# Patient Record
Sex: Female | Born: 1995 | Race: White | Hispanic: No | Marital: Single | Smoking: Current every day smoker
Health system: Southern US, Community
[De-identification: ages and names within clinical notes are randomized; demographics above are authoritative.]

## PROBLEM LIST (undated history)

## (undated) DIAGNOSIS — T7840XA Allergy, unspecified, initial encounter: Secondary | ICD-10-CM

## (undated) DIAGNOSIS — J45909 Unspecified asthma, uncomplicated: Secondary | ICD-10-CM

## (undated) DIAGNOSIS — Z91018 Allergy to other foods: Secondary | ICD-10-CM

## (undated) HISTORY — DX: Allergy, unspecified, initial encounter: T78.40XA

## (undated) HISTORY — PX: HX NO SURGICAL PROCEDURES: 2100001501

## (undated) HISTORY — DX: Allergy to other foods: Z91.018

## (undated) HISTORY — DX: Unspecified asthma, uncomplicated: J45.909

## (undated) HISTORY — PX: CHOLECYSTECTOMY: SHX55

---
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
## 2018-01-21 ENCOUNTER — Encounter (HOSPITAL_COMMUNITY): Payer: Self-pay | Admitting: Emergency Medicine

## 2018-01-21 ENCOUNTER — Other Ambulatory Visit: Payer: Self-pay

## 2018-01-21 ENCOUNTER — Emergency Department (HOSPITAL_COMMUNITY)
Admission: EM | Admit: 2018-01-21 | Discharge: 2018-01-21 | Disposition: A | Discharge status: Home / Self Care | Payer: Self-pay | Attending: Emergency Medicine | Admitting: Emergency Medicine

## 2018-01-21 DIAGNOSIS — F17210 Nicotine dependence, cigarettes, uncomplicated: Secondary | ICD-10-CM | POA: Insufficient documentation

## 2018-01-21 DIAGNOSIS — Z9049 Acquired absence of other specified parts of digestive tract: Secondary | ICD-10-CM | POA: Insufficient documentation

## 2018-01-21 DIAGNOSIS — M5442 Lumbago with sciatica, left side: Secondary | ICD-10-CM | POA: Insufficient documentation

## 2018-01-21 LAB — PREGNANCY, URINE: Preg Test, Ur: NEGATIVE

## 2018-01-21 MED ORDER — CYCLOBENZAPRINE HCL 10 MG PO TABS: 10.0000 mg | ORAL_TABLET | Freq: Two times a day (BID) | ORAL | 0 refills | Status: DC | PRN

## 2018-01-21 MED ORDER — LIDOCAINE 5 % EX PTCH
1.0000 | MEDICATED_PATCH | CUTANEOUS | Status: DC
  Administered 2018-01-21: 1 via TRANSDERMAL
  Filled 2018-01-21: qty 1

## 2018-01-21 MED ORDER — NAPROXEN 500 MG PO TABS: 500.0000 mg | ORAL_TABLET | Freq: Two times a day (BID) | ORAL | 0 refills | Status: DC

## 2018-01-21 MED ORDER — KETOROLAC TROMETHAMINE 30 MG/ML IJ SOLN
30.0000 mg | Freq: Once | INTRAMUSCULAR | Status: AC
  Administered 2018-01-21: 30 mg via INTRAMUSCULAR
  Filled 2018-01-21: qty 1

## 2018-01-21 MED ORDER — METHOCARBAMOL 500 MG PO TABS
500.0000 mg | ORAL_TABLET | Freq: Once | ORAL | Status: AC
  Administered 2018-01-21: 500 mg via ORAL
  Filled 2018-01-21: qty 1

## 2018-01-21 NOTE — Discharge Instructions (Signed)
You were seen here today for Back Pain: Low back pain is discomfort in the lower back that may be due to injuries to muscles and ligaments around the spine. Occasionally, it may be caused by a problem to a part of the spine called a disc. Your back pain should be treated with medicines listed below as well as back exercises and this back pain should get better over the next 2 weeks. Most patients get completely well in 4 weeks. It is important to know however, if you develop severe or worsening pain, low back pain with fever, numbness, weakness or inability to walk or urinate, you should return to the ER immediately.  Please follow up with your doctor this week for a recheck if still having symptoms.  HOME INSTRUCTIONS Self - care:  The application of heat can help soothe the pain.  Maintaining your daily activities, including walking (this is encouraged), as it will help you get better faster than just staying in bed. Do not life, push, pull anything more than 10 pounds for the next week. I am attaching back exercises that you can do at home to help facilitate your recovery.   Back Exercises - I have attached a handout on back exercises that can be done at home to help facilitate your recovery.   Medications are also useful to help with pain control.  In addition to the medications listed below you can also use over-the-counter remedies such as salon pas lidocane patches, Biofreeze gel, as well as ice and heat.  Acetaminophen.  This medication is generally safe, and found over the counter. Take as directed for your age. You should not take more than 8 of the extra strength (500mg ) pills a day (max dose is 4000mg  total OVER one day)  Non steroidal anti inflammatory: This includes medications including Ibuprofen, naproxen and Mobic; These medications help both pain and swelling and are very useful in treating back pain.  They should be taken with food, as they can cause stomach upset, and more seriously,  stomach bleeding. Do not combine the medications.   Muscle relaxants:  These medications can help with muscle tightness that is a cause of lower back pain.  Most of these medications can cause drowsiness, and it is not safe to drive or use dangerous machinery while taking them. They are primarily helpful when taken at night before sleep.  You will need to follow up with your primary healthcare provider in 1-2 weeks for reassessment and persistent symptoms.  Be aware that if you develop new symptoms, such as a fever, leg weakness, difficulty with or loss of control of your urine or bowels, abdominal pain, or more severe pain, you will need to seek medical attention and/or return to the Emergency department. Additional Information:  Your vital signs today were: BP 126/77 (BP Location: Left Arm)    Pulse 93    Temp 98 F (36.7 C) (Oral)    Resp 16    SpO2 99%  If your blood pressure (BP) was elevated above 135/85 this visit, please have this repeated by your doctor within one month. ---------------

## 2018-01-21 NOTE — ED Provider Notes (Signed)
[facility]-EMERGENCY DEPT Provider Note   CSN: [id number] Arrival date & time: 01/21/18  1834     History   Chief Complaint Chief Complaint  Patient presents with  . Leg Pain  . Back Pain    HPI Jane Carpenter is a 22 y.o. female.  Jane Carpenter is a 22 y.o. Female who is otherwise healthy, presents for evaluation of pain in her low back and left leg.  Pain was initially mild and started a few weeks ago but over the past 3 to 4 days is been more persistent and worsening.  Patient reports she works at a [employer] where she does a lot of heavy lifting frequently and wonders if this may have caused her she reports shooting and cramping pain down into her legs as well as pain over the left side of her low back, she reports it feels like her lower back is in a knot.  She denies any associated numbness or tingling in her legs.  No loss of bowel or bladder control or saddle anesthesia.  No associated abdominal pain, no dysuria urinary frequency.  No nausea or vomiting, no fevers or chills.  She has been taking ibuprofen intermittently with some improvement has not tried anything else to treat her symptoms.  No history of IV drug use or cancer.     No past medical history on file.  There are no active problems to display for this patient.   Past Surgical History:  Procedure Laterality Date  . CHOLECYSTECTOMY       OB History   None      Home Medications    Prior to Admission medications   Medication Sig Start Date End Date Taking? Authorizing Provider  cyclobenzaprine (FLEXERIL) 10 MG tablet Take 1 tablet (10 mg total) by mouth 2 (two) times daily as needed for muscle spasms. 01/21/18   Dartha Lodge, PA-C  naproxen (NAPROSYN) 500 MG tablet Take 1 tablet (500 mg total) by mouth 2 (two) times daily. 01/21/18   Dartha Lodge, PA-C    Family History No family history on file.  Social History Social History   Tobacco Use  .  Smoking status: Current Every Day Smoker    Types: Cigarettes  . Smokeless tobacco: Never Used  Substance Use Topics  . Alcohol use: Not on file  . Drug use: Not on file     Allergies   Patient has no known allergies.   Review of Systems Review of Systems  Constitutional: Negative for chills and fever.  HENT: Negative.   Respiratory: Negative for shortness of breath.   Cardiovascular: Negative for chest pain.  Gastrointestinal: Negative for abdominal pain, constipation, diarrhea, nausea and vomiting.  Genitourinary: Negative for dysuria, flank pain, frequency and hematuria.  Musculoskeletal: Positive for back pain and myalgias. Negative for arthralgias, gait problem, joint swelling and neck pain.  Skin: Negative for color change, rash and wound.  Neurological: Negative for weakness and numbness.     Physical Exam Updated Vital Signs BP 126/77 (BP Location: Left Arm)   Pulse 93   Temp 98 F (36.7 C) (Oral)   Resp 16   SpO2 99%   Physical Exam  Constitutional: She is oriented to person, place, and time. She appears well-developed and well-nourished. No distress.  HENT:  Head: Atraumatic.  Eyes: Right eye exhibits no discharge. Left eye exhibits no discharge.  Neck: Neck supple.  Cardiovascular:  Pulses:      Radial pulses are  2+ on the right side, and 2+ on the left side.       Dorsalis pedis pulses are 2+ on the right side, and 2+ on the left side.       Posterior tibial pulses are 2+ on the right side, and 2+ on the left side.  Pulmonary/Chest: Effort normal. No respiratory distress.  Abdominal: Soft. Bowel sounds are normal. She exhibits no distension and no mass. There is no tenderness. There is no guarding.  Abdomen soft, nondistended, nontender to palpation in all quadrants without guarding or peritoneal signs, no CVA tenderness bilaterally  Musculoskeletal:  Tenderness to palpation over over midline lumbar spine without palpable deformity and tenderness over  the left lower back musculature.  Pain made worse with range of motion of the lower extremities, positive straight leg raise on the left.  She does have some mild tenderness throughout the left leg without overlying skin changes or appreciable deformity, normal range of motion in all joints, no edema.  Neurological: She is alert and oriented to person, place, and time.  Alert, clear speech, following commands. Moving all extremities without difficulty. Bilateral lower extremities with 5/5 strength in proximal and distal muscle groups and with dorsi and plantar flexion. Sensation intact in bilateral lower extremities. 2+ patellar DTRs bilaterally. Ambulatory with steady gait  Skin: Skin is warm and dry. Capillary refill takes less than 2 seconds. She is not diaphoretic.  Psychiatric: She has a normal mood and affect. Her behavior is normal.  Nursing note and vitals reviewed.    ED Treatments / Results  Labs (all labs ordered are listed, but only abnormal results are displayed) Labs Reviewed  PREGNANCY, URINE    EKG None  Radiology No results found.  Procedures Procedures (including critical care time)  Medications Ordered in ED Medications  ketorolac (TORADOL) 30 MG/ML injection 30 mg (has no administration in time range)  methocarbamol (ROBAXIN) tablet 500 mg (has no administration in time range)  lidocaine (LIDODERM) 5 % 1 patch (has no administration in time range)     Initial Impression / Assessment and Plan / ED Course  I have reviewed the triage vital signs and the nursing notes.  Pertinent labs & imaging results that were available during my care of the patient were reviewed by me and considered in my medical decision making (see chart for details).  Normal neurological exam, no evidence of urinary incontinence or retention, pain is consistently reproducible. There is no evidence of AAA or concern for dissection at this time.   Patient can walk but states is painful.   No loss of bowel or bladder control.  No concern for cauda equina.  No fever, night sweats, weight loss, h/o cancer, IVDU.  Pain treated here in the department with adequate improvement. RICE protocol and pain medicine indicated and discussed with patient. I have also discussed reasons to return immediately to the ER.  Patient expresses understanding and agrees with plan.  Final Clinical Impressions(s) / ED Diagnoses   Final diagnoses:  Acute midline low back pain with left-sided sciatica    ED Discharge Orders         Ordered    naproxen (NAPROSYN) 500 MG tablet  2 times daily     01/21/18 2226    cyclobenzaprine (FLEXERIL) 10 MG tablet  2 times daily PRN     01/21/18 2226           Dartha Lodge, PA-C 01/21/18 2227    Arby Barrette, MD 01/22/18  1607  

## 2018-01-21 NOTE — ED Triage Notes (Signed)
Pt c/o left foot and leg pain that radiates to lower left back for couple days. Denies falls or injuries or heavy lifting. Denies urinary or bowel problems.

## 2018-01-21 NOTE — ED Notes (Signed)
Pt reports that she has burning, shooting, cramping pain to her left foot, to leg and radiates to left side of leg that worsens with ambulation pt rates 8/10 .

## 2018-05-30 ENCOUNTER — Ambulatory Visit (HOSPITAL_COMMUNITY)
Admission: EM | Admit: 2018-05-30 | Discharge: 2018-05-30 | Disposition: A | Discharge status: Home / Self Care | Payer: Self-pay | Attending: Family Medicine | Admitting: Family Medicine

## 2018-05-30 ENCOUNTER — Encounter (HOSPITAL_COMMUNITY): Payer: Self-pay

## 2018-05-30 DIAGNOSIS — Z202 Contact with and (suspected) exposure to infections with a predominantly sexual mode of transmission: Secondary | ICD-10-CM | POA: Insufficient documentation

## 2018-05-30 DIAGNOSIS — N898 Other specified noninflammatory disorders of vagina: Secondary | ICD-10-CM

## 2018-05-30 DIAGNOSIS — F17210 Nicotine dependence, cigarettes, uncomplicated: Secondary | ICD-10-CM

## 2018-05-30 DIAGNOSIS — Z113 Encounter for screening for infections with a predominantly sexual mode of transmission: Secondary | ICD-10-CM

## 2018-05-30 MED ORDER — AZITHROMYCIN 250 MG PO TABS
1000.0000 mg | ORAL_TABLET | Freq: Once | ORAL | Status: AC
  Administered 2018-05-30: 1000 mg via ORAL

## 2018-05-30 MED ORDER — AZITHROMYCIN 250 MG PO TABS
ORAL_TABLET | ORAL | Status: AC
  Filled 2018-05-30: qty 4

## 2018-05-30 MED ORDER — LIDOCAINE HCL (PF) 1 % IJ SOLN
INTRAMUSCULAR | Status: AC
  Filled 2018-05-30: qty 2

## 2018-05-30 MED ORDER — CEFTRIAXONE SODIUM 250 MG IJ SOLR
250.0000 mg | Freq: Once | INTRAMUSCULAR | Status: AC
  Administered 2018-05-30: 250 mg via INTRAMUSCULAR

## 2018-05-30 MED ORDER — CEFTRIAXONE SODIUM 250 MG IJ SOLR
INTRAMUSCULAR | Status: AC
  Filled 2018-05-30: qty 250

## 2018-05-30 NOTE — ED Notes (Signed)
Call back number verified and updated in EPIC... Adv pt to not have SI until lab results comeback neg.... Also adv pt lab results will be on MyChart; instructions given .... Pt verb understanding.   

## 2018-05-30 NOTE — Discharge Instructions (Signed)
We are treating you for STDs today based on exposure We will send a swab for testing and call you with any positive results

## 2018-05-30 NOTE — ED Provider Notes (Signed)
[facility]    CSN: [id number] Arrival date & time: 05/30/18  1146     History   Chief Complaint Chief Complaint  Patient presents with  . Exposure to STD    HPI Zanib Duane is a 23 y.o. female.   Pt is a 23 year old female that presents for STD screening and treatment. Reports that her sexual partner tested positive for gonorrhea and chlamydia. She is having vaginal discharge with odor. Denies any abdominal pain, back pain, pelvic pain, fevers, chills, dysuria, hematuria, frequency. She is on Depot for birth control. Reports that's she hasn't had a menstrual cycle in years.   ROS per HPI      History reviewed. No pertinent past medical history.  There are no active problems to display for this patient.   Past Surgical History:  Procedure Laterality Date  . CHOLECYSTECTOMY      OB History   No obstetric history on file.      Home Medications    Prior to Admission medications   Medication Sig Start Date End Date Taking? Authorizing Provider  cyclobenzaprine (FLEXERIL) 10 MG tablet Take 1 tablet (10 mg total) by mouth 2 (two) times daily as needed for muscle spasms. 01/21/18   Dartha Lodge, PA-C  naproxen (NAPROSYN) 500 MG tablet Take 1 tablet (500 mg total) by mouth 2 (two) times daily. 01/21/18   Dartha Lodge, PA-C    Family History History reviewed. No pertinent family history.  Social History Social History   Tobacco Use  . Smoking status: Current Every Day Smoker    Types: Cigarettes  . Smokeless tobacco: Never Used  Substance Use Topics  . Alcohol use: Not on file  . Drug use: Not on file     Allergies   Patient has no known allergies.   Review of Systems Review of Systems   Physical Exam Triage Vital Signs ED Triage Vitals  Enc Vitals Group     BP 05/30/18 1217 128/65     Pulse Rate 05/30/18 1217 99     Resp 05/30/18 1217 16     Temp 05/30/18 1217 97.7 F (36.5 C)     Temp Source 05/30/18 1217 Oral     SpO2  05/30/18 1217 100 %     Weight --      Height --      Head Circumference --      Peak Flow --      Pain Score 05/30/18 1219 0     Pain Loc --      Pain Edu? --      Excl. in GC? --    No data found.  Updated Vital Signs BP 128/65 (BP Location: Right Arm)   Pulse 99   Temp 97.7 F (36.5 C) (Oral)   Resp 16   LMP  (LMP Unknown)   SpO2 100%   Visual Acuity Right Eye Distance:   Left Eye Distance:   Bilateral Distance:    Right Eye Near:   Left Eye Near:    Bilateral Near:     Physical Exam Vitals signs and nursing note reviewed.  Constitutional:      General: She is not in acute distress.    Appearance: Normal appearance. She is not ill-appearing, toxic-appearing or diaphoretic.  HENT:     Head: Normocephalic.     Nose: Nose normal.     Mouth/Throat:     Pharynx: Oropharynx is clear.  Eyes:  Conjunctiva/sclera: Conjunctivae normal.  Neck:     Musculoskeletal: Normal range of motion.  Pulmonary:     Effort: Pulmonary effort is normal.  Abdominal:     Palpations: Abdomen is soft.     Tenderness: There is no abdominal tenderness.  Musculoskeletal: Normal range of motion.  Skin:    General: Skin is warm and dry.     Findings: No rash.  Neurological:     Mental Status: She is alert.  Psychiatric:        Mood and Affect: Mood normal.      UC Treatments / Results  Labs (all labs ordered are listed, but only abnormal results are displayed) Labs Reviewed  CERVICOVAGINAL ANCILLARY ONLY    EKG None  Radiology No results found.  Procedures Procedures (including critical care time)  Medications Ordered in UC Medications  cefTRIAXone (ROCEPHIN) injection 250 mg (250 mg Intramuscular Given 05/30/18 1330)  azithromycin (ZITHROMAX) tablet 1,000 mg (1,000 mg Oral Given 05/30/18 1330)    Initial Impression / Assessment and Plan / UC Course  I have reviewed the triage vital signs and the nursing notes.  Pertinent labs & imaging results that were  available during my care of the patient were reviewed by me and considered in my medical decision making (see chart for details).     Treated for gonorrhea and chlamydia based on exposure Self swab sent for testing Final Clinical Impressions(s) / UC Diagnoses   Final diagnoses:  STD exposure     Discharge Instructions     We are treating you for STDs today based on exposure We will send a swab for testing and call you with any positive results    ED Prescriptions    None     Controlled Substance Prescriptions [place] Controlled Substance Registry consulted? Not Applicable   Janace Aris, NP 05/30/18 1446

## 2018-05-31 LAB — CERVICOVAGINAL ANCILLARY ONLY
Bacterial vaginitis: POSITIVE — AB
Candida vaginitis: NEGATIVE
Chlamydia: POSITIVE — AB
Neisseria Gonorrhea: POSITIVE — AB
Trichomonas: NEGATIVE

## 2018-06-03 ENCOUNTER — Telehealth (HOSPITAL_COMMUNITY): Payer: Self-pay | Admitting: Emergency Medicine

## 2018-06-03 MED ORDER — METRONIDAZOLE 500 MG PO TABS: 500.0000 mg | ORAL_TABLET | Freq: Two times a day (BID) | ORAL | 0 refills | Status: AC

## 2018-06-03 NOTE — Telephone Encounter (Signed)
Chlamydia is positive.  This was treated at the urgent care visit with po zithromax 1g.  Pt needs education to please refrain from sexual intercourse for 7 days to give the medicine time to work.  Sexual partners need to be notified and tested/treated.  Condoms may reduce risk of reinfection.  Recheck or followup with PCP for further evaluation if symptoms are not improving.  [facility] notified.  Test for gonorrhea was positive. This was treated at the urgent care visit with IM rocephin 250mg  and po zithromax 1g. Pt needs education to refrain from sexual intercourse for 7 days after treatment to give the medicine time to work. Sexual partners need to be notified and tested/treated. Condoms may reduce risk of reinfection. Recheck or followup with PCP for further evaluation if symptoms are not improving. [facility] notified.   Bacterial vaginosis is positive. This was not treated at the urgent care visit.  Flagyl 500 mg BID x 7 days #14 no refills sent to patients pharmacy of choice.    Patient contacted and made aware of all results, all questions answered.

## 2018-06-17 ENCOUNTER — Emergency Department (HOSPITAL_COMMUNITY)
Admission: EM | Admit: 2018-06-17 | Discharge: 2018-06-17 | Disposition: A | Discharge status: Home / Self Care | Payer: Self-pay | Attending: Emergency Medicine | Admitting: Emergency Medicine

## 2018-06-17 ENCOUNTER — Other Ambulatory Visit: Payer: Self-pay

## 2018-06-17 ENCOUNTER — Encounter (HOSPITAL_COMMUNITY): Payer: Self-pay | Admitting: *Deleted

## 2018-06-17 DIAGNOSIS — J45909 Unspecified asthma, uncomplicated: Secondary | ICD-10-CM | POA: Insufficient documentation

## 2018-06-17 DIAGNOSIS — J069 Acute upper respiratory infection, unspecified: Secondary | ICD-10-CM | POA: Insufficient documentation

## 2018-06-17 DIAGNOSIS — R197 Diarrhea, unspecified: Secondary | ICD-10-CM | POA: Insufficient documentation

## 2018-06-17 DIAGNOSIS — F17210 Nicotine dependence, cigarettes, uncomplicated: Secondary | ICD-10-CM | POA: Insufficient documentation

## 2018-06-17 DIAGNOSIS — Z79899 Other long term (current) drug therapy: Secondary | ICD-10-CM | POA: Insufficient documentation

## 2018-06-17 HISTORY — DX: Unspecified asthma, uncomplicated: J45.909

## 2018-06-17 MED ORDER — CETIRIZINE HCL 10 MG PO TABS: 10.0000 mg | ORAL_TABLET | Freq: Every day | ORAL | 0 refills | Status: DC

## 2018-06-17 MED ORDER — FLUTICASONE PROPIONATE 50 MCG/ACT NA SUSP: 1.0000 | Freq: Every day | NASAL | 2 refills | Status: DC

## 2018-06-17 MED ORDER — BENZONATATE 200 MG PO CAPS: 200.0000 mg | ORAL_CAPSULE | Freq: Three times a day (TID) | ORAL | 0 refills | Status: DC

## 2018-06-17 NOTE — ED Provider Notes (Signed)
[facility]-EMERGENCY DEPT Provider Note   CSN: [id number] Arrival date & time: 06/17/18  1635    History   Chief Complaint Chief Complaint  Patient presents with  . Nasal Congestion  . Diarrhea    HPI Jane Carpenter is a 23 y.o. female.     22yo female presents with complaint of non productive cough, congestion (mostly right side face), sneezing, sore throat, right ear pressure and loose non bloody stools. Patient thought it was her allergies but in light of coronovirus pandemic thought she should go to the ER for evaluation. Denies risk factors- no recent travel, sick contacts, contact with anyone who has traveled. No other complaints or concerns.      Past Medical History:  Diagnosis Date  . Asthma     There are no active problems to display for this patient.   Past Surgical History:  Procedure Laterality Date  . CHOLECYSTECTOMY       OB History   No obstetric history on file.      Home Medications    Prior to Admission medications   Medication Sig Start Date End Date Taking? Authorizing Provider  benzonatate (TESSALON) 200 MG capsule Take 1 capsule (200 mg total) by mouth 3 (three) times daily. 06/17/18   Jeannie Fend, PA-C  cetirizine (ZYRTEC ALLERGY) 10 MG tablet Take 1 tablet (10 mg total) by mouth daily. 06/17/18   Jeannie Fend, PA-C  cyclobenzaprine (FLEXERIL) 10 MG tablet Take 1 tablet (10 mg total) by mouth 2 (two) times daily as needed for muscle spasms. 01/21/18   Dartha Lodge, PA-C  fluticasone (FLONASE) 50 MCG/ACT nasal spray Place 1 spray into both nostrils daily. 06/17/18   Jeannie Fend, PA-C  naproxen (NAPROSYN) 500 MG tablet Take 1 tablet (500 mg total) by mouth 2 (two) times daily. 01/21/18   Dartha Lodge, PA-C    Family History No family history on file.  Social History Social History   Tobacco Use  . Smoking status: Current Every Day Smoker    Packs/day: 0.50    Types: Cigarettes  . Smokeless  tobacco: Never Used  Substance Use Topics  . Alcohol use: Yes  . Drug use: Never     Allergies   Patient has no known allergies.   Review of Systems Review of Systems  Constitutional: Negative for chills, fatigue and fever.  HENT: Positive for congestion, ear pain, sinus pressure, sneezing and sore throat. Negative for facial swelling.   Eyes: Negative for discharge and redness.  Respiratory: Positive for cough. Negative for shortness of breath.   Cardiovascular: Negative for chest pain.  Gastrointestinal: Positive for diarrhea and nausea. Negative for abdominal pain, blood in stool, constipation and vomiting.  Musculoskeletal: Negative for arthralgias and myalgias.  Skin: Negative for rash and wound.  Allergic/Immunologic: Negative for immunocompromised state.  Neurological: Negative for headaches.  Hematological: Negative for adenopathy.  Psychiatric/Behavioral: Negative for confusion.  All other systems reviewed and are negative.    Physical Exam Updated Vital Signs BP 130/69   Pulse (!) 105   Temp 98.7 F (37.1 C) (Oral)   Resp (!) 21   LMP  (LMP Unknown)   SpO2 100%   Physical Exam Vitals signs and nursing note reviewed.  Constitutional:      Appearance: She is obese.  HENT:     Head: Normocephalic and atraumatic.     Right Ear: Tympanic membrane and ear canal normal.     Left Ear: Tympanic membrane  and ear canal normal.     Nose: Congestion present.     Mouth/Throat:     Mouth: Mucous membranes are moist.     Pharynx: No oropharyngeal exudate or posterior oropharyngeal erythema.  Eyes:     Conjunctiva/sclera: Conjunctivae normal.  Neck:     Musculoskeletal: Neck supple.  Cardiovascular:     Rate and Rhythm: Normal rate and regular rhythm.     Pulses: Normal pulses.     Heart sounds: Normal heart sounds.  Pulmonary:     Effort: Pulmonary effort is normal.     Breath sounds: Normal breath sounds.  Abdominal:     Tenderness: There is no abdominal  tenderness.  Lymphadenopathy:     Cervical: No cervical adenopathy.  Skin:    General: Skin is warm and dry.     Findings: No erythema or rash.  Neurological:     Mental Status: She is alert and oriented to person, place, and time.  Psychiatric:        Behavior: Behavior normal.      ED Treatments / Results  Labs (all labs ordered are listed, but only abnormal results are displayed) Labs Reviewed - No data to display  EKG None  Radiology No results found.  Procedures Procedures (including critical care time)  Medications Ordered in ED Medications - No data to display   Initial Impression / Assessment and Plan / ED Course  I have reviewed the triage vital signs and the nursing notes.  Pertinent labs & imaging results that were available during my care of the patient were reviewed by me and considered in my medical decision making (see chart for details).  Clinical Course as of Jun 16 1845  Mon Jun 17, 2018  1845 Jane Carpenter female with 3 days or URI symptoms with report of diarrhea. Congested on exam, otherwise well appearing. Suspect her loose stool is a result of sinus congestion/increased mucous production. Recommend supportive/symptomatic treatment at home, recheck PCP as needed, return to ER or call telehealth for worsening or concerning symptoms.   [LM]    Clinical Course User Index [LM] Jeannie Fend, PA-C   Final Clinical Impressions(s) / ED Diagnoses   Final diagnoses:  Viral upper respiratory tract infection    ED Discharge Orders         Ordered    benzonatate (TESSALON) 200 MG capsule  3 times daily     06/17/18 1839    fluticasone (FLONASE) 50 MCG/ACT nasal spray  Daily     06/17/18 1839    cetirizine (ZYRTEC ALLERGY) 10 MG tablet  Daily     06/17/18 1839           Alden Hipp 06/17/18 1847    Maia Plan, MD 06/17/18 2355

## 2018-06-17 NOTE — Discharge Instructions (Signed)
Home to rest.  Push hydrating fluids such as Gatorade, drink plenty of water. Take Tessalon as needed as prescribed for cough. Use saline sinus rinse twice daily, use Flonase and Zyrtec. Follow-up with your primary care provider, contact telehealth for worsening or concerning symptoms.

## 2018-06-17 NOTE — ED Triage Notes (Signed)
Pt reports migraine h/a, R otalgia, rhinorrhea, with diarrhea and nausea.  Diarrhea x 3 today.

## 2018-06-19 ENCOUNTER — Telehealth: Payer: Self-pay | Admitting: Family

## 2018-06-19 DIAGNOSIS — J069 Acute upper respiratory infection, unspecified: Secondary | ICD-10-CM

## 2018-06-19 NOTE — Progress Notes (Signed)
We are sorry you are not feeling well.  Here is how we plan to help!  Are your taking your zyrtec and flonase every day? Have you been taking the naproxen for the headache. These symptoms can last a 5-7 days. Make sure you are getting lots of rest and drinking lots of fluids.   Approximately 5 minutes was spent documenting and reviewing patient's chart.    Based on what you have shared with me, it looks like you may have a viral upper respiratory infection.  Upper respiratory infections are caused by a large number of viruses; however, rhinovirus is the most common cause.   Symptoms vary from person to person, with common symptoms including sore throat, cough, and fatigue or lack of energy.  A low-grade fever of up to 100.4 may present, but is often uncommon.  Symptoms vary however, and are closely related to a person's age or underlying illnesses.  The most common symptoms associated with an upper respiratory infection are nasal discharge or congestion, cough, sneezing, headache and pressure in the ears and face.  These symptoms usually persist for about 3 to 10 days, but can last up to 2 weeks.  It is important to know that upper respiratory infections do not cause serious illness or complications in most cases.    Upper respiratory infections can be transmitted from person to person, with the most common method of transmission being a person's hands.  The virus is able to live on the skin and can infect other persons for up to 2 hours after direct contact.  Also, these can be transmitted when someone coughs or sneezes; thus, it is important to cover the mouth to reduce this risk.  To keep the spread of the illness at bay, good hand hygiene is very important.  This is an infection that is most likely caused by a virus. There are no specific treatments other than to help you with the symptoms until the infection runs its course.  We are sorry you are not feeling well.  Here is how we plan to  help!   For nasal congestion, you may use an oral decongestants such as Mucinex D or if you have glaucoma or high blood pressure use plain Mucinex.  Saline nasal spray or nasal drops can help and can safely be used as often as needed for congestion.  For your congestion,continue your  Fluticasone nasal spray one spray in each nostril twice a day  If you do not have a history of heart disease, hypertension, diabetes or thyroid disease, prostate/bladder issues or glaucoma, you may also use Sudafed to treat nasal congestion.  It is highly recommended that you consult with a pharmacist or your primary care physician to ensure this medication is safe for you to take.     If you have a cough, you may use cough suppressants such as Delsym and Robitussin.  If you have glaucoma or high blood pressure, you can also use Coricidin HBP.    If you have a sore or scratchy throat, use a saltwater gargle-  to  teaspoon of salt dissolved in a 4-ounce to 8-ounce glass of warm water.  Gargle the solution for approximately 15-30 seconds and then spit.  It is important not to swallow the solution.  You can also use throat lozenges/cough drops and Chloraseptic spray to help with throat pain or discomfort.  Warm or cold liquids can also be helpful in relieving throat pain.  For headache, pain or general discomfort,  you can use Ibuprofen or Tylenol as directed.   Some authorities believe that zinc sprays or the use of Echinacea may shorten the course of your symptoms.   HOME CARE . Only take medications as instructed by your medical team. . Be sure to drink plenty of fluids. Water is fine as well as fruit juices, sodas and electrolyte beverages. You may want to stay away from caffeine or alcohol. If you are nauseated, try taking small sips of liquids. How do you know if you are getting enough fluid? Your urine should be a pale yellow or almost colorless. . Get rest. . Taking a steamy shower or using a humidifier may  help nasal congestion and ease sore throat pain. You can place a towel over your head and breathe in the steam from hot water coming from a faucet. . Using a saline nasal spray works much the same way. . Cough drops, hard candies and sore throat lozenges may ease your cough. . Avoid close contacts especially the very young and the elderly . Cover your mouth if you cough or sneeze . Always remember to wash your hands.   GET HELP RIGHT AWAY IF: . You develop worsening fever. . If your symptoms do not improve within 10 days . You develop yellow or green discharge from your nose over 3 days. . You have coughing fits . You develop a severe head ache or visual changes. . You develop shortness of breath, difficulty breathing or start having chest pain . Your symptoms persist after you have completed your treatment plan  MAKE SURE YOU   Understand these instructions.  Will watch your condition.  Will get help right away if you are not doing well or get worse.  Your e-visit answers were reviewed by a board certified advanced clinical practitioner to complete your personal care plan. Depending upon the condition, your plan could have included both over the counter or prescription medications. Please review your pharmacy choice. If there is a problem, you may call our nursing hot line at and have the prescription routed to another pharmacy. Your safety is important to [place]. If you have drug allergies check your prescription carefully.   You can use MyChart to ask questions about today's visit, request a non-urgent call back, or ask for a work or school excuse for 24 hours related to this e-Visit. If it has been greater than 24 hours you will need to follow up with your provider, or enter a new e-Visit to address those concerns. You will get an e-mail in the next two days asking about your experience.  I hope that your e-visit has been valuable and will speed your recovery. Thank you for using  e-visits.

## 2018-06-22 ENCOUNTER — Ambulatory Visit (HOSPITAL_COMMUNITY)
Admission: EM | Admit: 2018-06-22 | Discharge: 2018-06-22 | Disposition: A | Discharge status: Home / Self Care | Payer: Self-pay

## 2018-06-22 ENCOUNTER — Other Ambulatory Visit: Payer: Self-pay

## 2018-06-22 ENCOUNTER — Encounter (HOSPITAL_COMMUNITY): Payer: Self-pay

## 2018-06-22 DIAGNOSIS — R059 Cough, unspecified: Secondary | ICD-10-CM

## 2018-06-22 DIAGNOSIS — R05 Cough: Secondary | ICD-10-CM

## 2018-06-22 NOTE — ED Provider Notes (Signed)
06/22/2018 1:03 PM   DOB: [birth year] / MRN: [id number]  SUBJECTIVE:  Jane Carpenter is a well appearing afebrile  23 y.o. female with normal vital signs presenting for eval of SOB that started 5 days ago when she presented to the ED and advised she had a mild illness and was advised to stay home.  She did an evisit for same yesterday.  She wants FMLA paperwork completed today.  She does not have a PCP. No SOB, CP, leg swelling  She is allergic to codeine.   She  has a past medical history of Asthma.    She  reports that she has been smoking cigarettes. She has been smoking about 0.50 packs per day. She has never used smokeless tobacco. She reports current alcohol use. She reports that she does not use drugs. She  has no history on file for sexual activity. The patient  has a past surgical history that includes Cholecystectomy.  Her family history is not on file.  ROS PER HPI  OBJECTIVE:  BP 132/85 (BP Location: Left Arm)   Pulse 94   Temp 98.1 F (36.7 C) (Oral)   Resp 16   LMP  (LMP Unknown)   SpO2 98%   Wt Readings from Last 3 Encounters:  No data found for Wt   Temp Readings from Last 3 Encounters:  06/22/18 98.1 F (36.7 C) (Oral)  06/17/18 98.7 F (37.1 C) (Oral)  05/30/18 97.7 F (36.5 C) (Oral)   BP Readings from Last 3 Encounters:  06/22/18 132/85  06/17/18 130/69  05/30/18 128/65   Pulse Readings from Last 3 Encounters:  06/22/18 94  06/17/18 (!) 105  05/30/18 99    Physical Exam Vitals signs reviewed.  Constitutional:      General: She is not in acute distress.    Appearance: She is not diaphoretic.  HENT:     Mouth/Throat:     Mouth: Mucous membranes are moist.  Eyes:     Pupils: Pupils are equal, round, and reactive to light.  Neck:     Musculoskeletal: Normal range of motion.  Cardiovascular:     Rate and Rhythm: Normal rate.  Pulmonary:     Effort: Pulmonary effort is normal. No respiratory distress.  Abdominal:     General: There is no  distension.  Musculoskeletal:     Right lower leg: No edema.     Left lower leg: No edema.  Skin:    General: Skin is dry.     Coloration: Skin is not pale.  Neurological:     Mental Status: She is alert and oriented to person, place, and time.     Cranial Nerves: No cranial nerve deficit.     Gait: Gait normal.     No results found for this or any previous visit (from the past 72 hour(s)).  No results found.  ASSESSMENT AND PLAN:   Cough: Normal vitals.  Normal exam.     Discharge Instructions     Please stay home and avoid sick people. You will need to find a PCP to complete FMLA.         The patient is advised to call or return to clinic if she does not see an improvement in symptoms, or to seek the care of the closest emergency department if she worsens with the above plan.   Deliah Boston, MHS, PA-C 06/22/2018 1:03 PM   Ofilia Neas, PA-C 06/22/18 1303

## 2018-06-22 NOTE — Discharge Instructions (Signed)
Please stay home and avoid sick people. You will need to find a PCP to complete FMLA.

## 2018-06-22 NOTE — ED Triage Notes (Signed)
Pt presents today with cough, some SOB, wheezing, burning up, and sore throat. Was dx with a URI at the hospital and was given an antibiotic but could not afford it due to no insurance. She has only been taking some OTC dayquil. Has been sick x5 days.

## 2018-07-09 ENCOUNTER — Telehealth: Payer: Self-pay | Admitting: Physician Assistant

## 2018-07-09 DIAGNOSIS — J069 Acute upper respiratory infection, unspecified: Secondary | ICD-10-CM

## 2018-07-09 MED ORDER — IPRATROPIUM BROMIDE 0.03 % NA SOLN: 2.0000 | Freq: Three times a day (TID) | NASAL | 12 refills | Status: DC

## 2018-07-09 MED ORDER — LIDOCAINE VISCOUS HCL 2 % MT SOLN: 5.0000 mL | OROMUCOSAL | 0 refills | Status: DC | PRN

## 2018-07-09 NOTE — Progress Notes (Signed)
We are sorry you are not feeling well.  Here is how we plan to help!  Based on what you have shared with me, it looks like you may have a viral upper respiratory infection.  Upper respiratory infections are caused by a large number of viruses; however, rhinovirus is the most common cause.   Symptoms vary from person to person, with common symptoms including sore throat, cough, and fatigue or lack of energy.  A low-grade fever of up to 100.4 may present, but is often uncommon.  Symptoms vary however, and are closely related to a person's age or underlying illnesses.  The most common symptoms associated with an upper respiratory infection are nasal discharge or congestion, cough, sneezing, headache and pressure in the ears and face.  These symptoms usually persist for about 3 to 10 days, but can last up to 2 weeks.  It is important to know that upper respiratory infections do not cause serious illness or complications in most cases.    Upper respiratory infections can be transmitted from person to person, with the most common method of transmission being a person's hands.  The virus is able to live on the skin and can infect other persons for up to 2 hours after direct contact.  Also, these can be transmitted when someone coughs or sneezes; thus, it is important to cover the mouth to reduce this risk.  To keep the spread of the illness at bay, good hand hygiene is very important.  This is an infection that is most likely caused by a virus. There are no specific treatments other than to help you with the symptoms until the infection runs its course.  We are sorry you are not feeling well.  Here is how we plan to help!   For nasal congestion, you may use an oral decongestants such as Mucinex D or if you have glaucoma or high blood pressure use plain Mucinex.  Saline nasal spray or nasal drops can help and can safely be used as often as needed for congestion.  For your congestion, I have prescribed Ipratropium  Bromide nasal spray 0.03% two sprays in each nostril 2-3 times a day  If you do not have a history of heart disease, hypertension, diabetes or thyroid disease, prostate/bladder issues or glaucoma, you may also use Sudafed to treat nasal congestion.  It is highly recommended that you consult with a pharmacist or your primary care physician to ensure this medication is safe for you to take.     For sore throat I have prescribed viscous lidocaine, a gargle that can help to ease the pain in your throat.   If you have a cough, you may use cough suppressants such as Delsym and Robitussin.  If you have glaucoma or high blood pressure, you can also use Coricidin HBP.     If you have a sore or scratchy throat, use a saltwater gargle-  to  teaspoon of salt dissolved in a 4-ounce to 8-ounce glass of warm water.  Gargle the solution for approximately 15-30 seconds and then spit.  It is important not to swallow the solution.  You can also use throat lozenges/cough drops and Chloraseptic spray to help with throat pain or discomfort.  Warm or cold liquids can also be helpful in relieving throat pain.  For headache, pain or general discomfort, you can use Ibuprofen or Tylenol as directed.   Some authorities believe that zinc sprays or the use of Echinacea may shorten the course of  your symptoms.   HOME CARE Only take medications as instructed by your medical team. Be sure to drink plenty of fluids. Water is fine as well as fruit juices, sodas and electrolyte beverages. You may want to stay away from caffeine or alcohol. If you are nauseated, try taking small sips of liquids. How do you know if you are getting enough fluid? Your urine should be a pale yellow or almost colorless. Get rest. Taking a steamy shower or using a humidifier may help nasal congestion and ease sore throat pain. You can place a towel over your head and breathe in the steam from hot water coming from a faucet. Using a saline nasal spray  works much the same way. Cough drops, hard candies and sore throat lozenges may ease your cough. Avoid close contacts especially the very young and the elderly Cover your mouth if you cough or sneeze Always remember to wash your hands.   GET HELP RIGHT AWAY IF: You develop worsening fever. If your symptoms do not improve within 10 days You develop yellow or green discharge from your nose over 3 days. You have coughing fits You develop a severe head ache or visual changes. You develop shortness of breath, difficulty breathing or start having chest pain  Your symptoms persist after you have completed your treatment plan  MAKE SURE YOU  Understand these instructions. Will watch your condition. Will get help right away if you are not doing well or get worse.  Your e-visit answers were reviewed by a board certified advanced clinical practitioner to complete your personal care plan. Depending upon the condition, your plan could have included both over the counter or prescription medications. Please review your pharmacy choice. If there is a problem, you may call our nursing hot line at and have the prescription routed to another pharmacy. Your safety is important to [place]. If you have drug allergies check your prescription carefully.   You can use MyChart to ask questions about today's visit, request a non-urgent call back, or ask for a work or school excuse for 24 hours related to this e-Visit. If it has been greater than 24 hours you will need to follow up with your provider, or enter a new e-Visit to address those concerns. You will get an e-mail in the next two days asking about your experience.  I hope that your e-visit has been valuable and will speed your recovery. Thank you for using e-visits.      ===View-only below this line===   ----- Message -----    From: Lennart Pall    Sent: 07/09/2018 11:00 AM EDT      To: E-Visit Mailing List Subject: E-Visit Submission: Sinus  Problems  E-Visit Submission: Sinus Problems --------------------------------  Question: Which of the following have you been experiencing? Answer:   Congested nose            Pain around the nose and face            Cough  Question: Have these symptoms significantly worsened over the last two to three days? Answer:   Yes  Question: Have you had any of the following? Answer:   Difficulty swallowing  Question: Please enter a few details about your swallowing, breathing, or visual problems. Answer:   My head is very congested causing my head to hurt pretty bad. My nose is stopped up to where I pretty much can't breathe out of it. My throat is so sore to the point to where  it hurts to swallow especially food. My eyes and nose feel like there is pressure on them  Question: How long have you been having these symptoms? Answer:   4 days  Question: Do you have a fever? Answer:   No, I do not have a fever  Question: Do you smoke? Answer:   Yes  Question: Do you have any chronic illnesses, such as diabetes, heart disease, kidney disease, or lung disease, or any illness that would weaken your body's ability to fight infection? Answer:   I am not sure  Question: Please enter details of your other illness. Answer:   I have asthma  Question: When you blow your nose, what color is the mucus? Answer:   Mostly thin and yellow or green  Question: Have you experienced similar problems in the past? Answer:   Yes  Question: What treatments have worked in the past?  Answer:   I was told to take some over the counter medicine and use warm salt water because I couldn't afford my medicine  Question: What treatment(s) in the past have been unsuccessful? Answer:     Question: Is this illness similar to previous illnesses you have had?  How is it the same?  How is it different? Answer:   It's exactly the same as when I had a viral upper respiratory infection a few weeks ago  Question: Have you  recently been hospitalized? Answer:   No  Question: What medications are you currently taking for these symptoms? Answer:   Decongestants  Question: Please enter the names of any medications you are taking, or any other treatments you are trying. Answer:   Day Quil  Question: Are you pregnant? Answer:   I am confident that I am not pregnant  Question: Are you breastfeeding? Answer:   No  Question: Please list your medication allergies that you may have ? (If 'none' , please list as 'none') Answer:   Codeine  Question: Please list any additional comments  Answer:     A total of 5-10 minutes was spent evaluating this patients questionnaire and formulating a plan of care.

## 2018-10-22 ENCOUNTER — Telehealth: Payer: Self-pay | Admitting: Physician Assistant

## 2018-10-22 ENCOUNTER — Encounter: Payer: Self-pay | Admitting: Physician Assistant

## 2018-10-22 DIAGNOSIS — Z20822 Contact with and (suspected) exposure to covid-19: Secondary | ICD-10-CM

## 2018-10-22 DIAGNOSIS — R6889 Other general symptoms and signs: Secondary | ICD-10-CM

## 2018-10-22 DIAGNOSIS — R05 Cough: Secondary | ICD-10-CM

## 2018-10-22 DIAGNOSIS — R059 Cough, unspecified: Secondary | ICD-10-CM

## 2018-10-22 DIAGNOSIS — J029 Acute pharyngitis, unspecified: Secondary | ICD-10-CM

## 2018-10-22 DIAGNOSIS — R0981 Nasal congestion: Secondary | ICD-10-CM

## 2018-10-22 MED ORDER — BENZONATATE 100 MG PO CAPS: 100.0000 mg | ORAL_CAPSULE | Freq: Two times a day (BID) | ORAL | 0 refills | Status: DC | PRN

## 2018-10-22 MED ORDER — AMOXICILLIN 500 MG PO CAPS: 500.0000 mg | ORAL_CAPSULE | Freq: Two times a day (BID) | ORAL | 0 refills | Status: DC

## 2018-10-22 MED ORDER — FLUTICASONE PROPIONATE 50 MCG/ACT NA SUSP: 2.0000 | Freq: Every day | NASAL | 6 refills | Status: DC

## 2018-10-22 NOTE — Progress Notes (Signed)
E-Visit for Corona Virus Screening   Your current symptoms could be consistent with the coronavirus.  Many health care providers can now test patients at their office but not all are.  [facility] has multiple testing sites. For information on our COVID testing locations and hours go to [email or web address]  Please quarantine yourself while awaiting your test results.  We are enrolling you in our [place] [facility] for [place] . Daily you will receive a questionnaire within the [facility] website. Our COVID 19 response team willl be monitoriing your responses daily.    COVID-19 is a respiratory illness with symptoms that are similar to the flu. Symptoms are typically mild to moderate, but there have been cases of severe illness and death due to the virus. The following symptoms may appear 2-14 days after exposure: . Fever . Cough . Shortness of breath or difficulty breathing . Chills . Repeated shaking with chills . Muscle pain . Headache . Sore throat . New loss of taste or smell . Fatigue . Congestion or runny nose . Nausea or vomiting . Diarrhea  It is vitally important that if you feel that you have an infection such as this virus or any other virus that you stay home and away from places where you may spread it to others.  You should self-quarantine for 14 days if you have symptoms that could potentially be coronavirus or have been in close contact a with a person diagnosed with COVID-19 within the last 2 weeks. You should avoid contact with people age 88 and older.   You should wear a mask or cloth face covering over your nose and mouth if you must be around other people or animals, including pets (even at home). Try to stay at least 6 feet away from other people. This will protect the people around you.  You can use medication such as A prescription cough medication called Tessalon Perles 100 mg. You may take 1-2 capsules every 8 hours as needed for  cough  You may also take acetaminophen (Tylenol) as needed for fever.  You have been prescribed Flonase for your nasal congestion.  Due to persistent sore throat and severity of the sore throat with associated pain with swallowing, I have prescribed Amoxicillin 500 mg twice daily for 10 days- see note below about sore throat.   I have provided a work note You have been enrolled in [facility], which will contact you regarding your symptoms.   Pharyngitis:  Your symptoms indicate a likely viral infection (Pharyngitis).   Pharyngitis is inflammation in the back of the throat which can cause a sore throat, scratchiness and sometimes difficulty swallowing.   Pharyngitis is typically caused by a respiratory virus and will just run its course.  Please keep in mind that your symptoms could last up to 10 days.  For throat pain, we recommend over the counter oral pain relief medications such as acetaminophen or aspirin, or anti-inflammatory medications such as ibuprofen or naproxen sodium.  Topical treatments such as oral throat lozenges or sprays may be used as needed.  Avoid close contact with loved ones, especially the very young and elderly.  Remember to wash your hands thoroughly throughout the day as this is the number one way to prevent the spread of infection and wipe down door knobs and counters with disinfectant.  AI have prescribed Amoxicillin 500 mg as above.    Reduce your risk of any infection by using the same precautions used for avoiding  the common cold or flu:  Marland Kitchen. Wash your hands often with soap and warm water for at least 20 seconds.  If soap and water are not readily available, use an alcohol-based hand sanitizer with at least 60% alcohol.  . If coughing or sneezing, cover your mouth and nose by coughing or sneezing into the elbow areas of your shirt or coat, into a tissue or into your sleeve (not your hands). . Avoid shaking hands with others and  consider head nods or verbal greetings only. . Avoid touching your eyes, nose, or mouth with unwashed hands.  . Avoid close contact with people who are sick. . Avoid places or events with large numbers of people in one location, like concerts or sporting events. . Carefully consider travel plans you have or are making. . If you are planning any travel outside or inside the [place], visit the CDC's Travelers' Health webpage for the latest health notices. . If you have some symptoms but not all symptoms, continue to monitor at home and seek medical attention if your symptoms worsen. . If you are having a medical emergency, call 911.  HOME CARE . Only take medications as instructed by your medical team. . Drink plenty of fluids and get plenty of rest. . A steam or ultrasonic humidifier can help if you have congestion.   GET HELP RIGHT AWAY IF YOU HAVE EMERGENCY WARNING SIGNS** FOR COVID-19. If you or someone is showing any of these signs seek emergency medical care immediately. Call 911 or proceed to your closest emergency facility if: . You develop worsening high fever. . Trouble breathing . Bluish lips or face . Persistent pain or pressure in the chest . New confusion . Inability to wake or stay awake . You cough up blood. . Your symptoms become more severe  **This list is not all possible symptoms. Contact your medical provider for any symptoms that are sever or concerning to you    MAKE SURE YOU   Understand these instructions.  Will watch your condition.  Will get help right away if you are not doing well or get worse.  Your e-visit answers were reviewed by a board certified advanced clinical practitioner to complete your personal care plan.  Depending on the condition, your plan could have included both over the counter or prescription medications.  If there is a problem please reply once you have received a response from your provider.  Your safety is important to us.  If you  have drug allergies check your prescription carefully.    You can use MyChart to ask questions about today's visit, request a non-urgent call back, or ask for a work or school excuse for 24 hours related to this e-Visit. If it has been greater than 24 hours you will need to follow up with your provider, or enter a new e-Visit to address those concerns. You will get an e-mail in the next two days asking about your experience.  I hope that your e-visit has been valuable and will speed your recovery. Thank you for using e-visits.   I spent 5-10 minutes on review and completion of this note- Illa LevelSahar Osman [facility]

## 2019-03-11 ENCOUNTER — Telehealth: Payer: Self-pay | Admitting: Nurse Practitioner

## 2019-03-11 DIAGNOSIS — B9789 Other viral agents as the cause of diseases classified elsewhere: Secondary | ICD-10-CM

## 2019-03-11 DIAGNOSIS — J329 Chronic sinusitis, unspecified: Secondary | ICD-10-CM

## 2019-03-11 MED ORDER — FLUTICASONE PROPIONATE 50 MCG/ACT NA SUSP: 2.0000 | Freq: Every day | NASAL | 6 refills | Status: DC

## 2019-03-11 NOTE — Progress Notes (Signed)

## 2019-04-03 ENCOUNTER — Telehealth: Payer: Self-pay | Admitting: Physician Assistant

## 2019-04-03 DIAGNOSIS — J029 Acute pharyngitis, unspecified: Secondary | ICD-10-CM

## 2019-04-03 DIAGNOSIS — J019 Acute sinusitis, unspecified: Secondary | ICD-10-CM

## 2019-04-03 MED ORDER — AMOXICILLIN-POT CLAVULANATE 875-125 MG PO TABS: 1.0000 | ORAL_TABLET | Freq: Two times a day (BID) | ORAL | 0 refills | Status: DC

## 2019-04-03 NOTE — Progress Notes (Signed)
We are sorry that you are not feeling well.  Here is how we plan to help!  Based on what you have shared with me it looks like you have sinusitis.  Sinusitis is inflammation and infection in the sinus cavities of the head.  Based on your presentation I believe you most likely have Acute Bacterial Sinusitis.  This is an infection caused by bacteria and is treated with antibiotics. I have prescribed Augmentin 875mg /125mg  one tablet twice daily with food, for 7 days. You may use an oral decongestant such as Mucinex D or if you have glaucoma or high blood pressure use plain Mucinex. Saline nasal spray help and can safely be used as often as needed for congestion.  If you develop worsening sinus pain, fever or notice severe headache and vision changes, or if symptoms are not better after completion of antibiotic, please schedule an appointment with a health care provider.    Your questionnaire indicated that you also had a sore throat.  This may be from postnasal drip from your sinus congestion or an infection in your throat.  If you have strep throat, the medication prescribed for your sinus infection will treat it.  If it anytime you have difficulty swallowing, difficulty breathing or feel as if the swelling in your throat is worsening you will need to have an in person evaluation to assure that you have not developed complications from a throat infection.  Sinus infections are not as easily transmitted as other respiratory infection, however we still recommend that you avoid close contact with loved ones, especially the very young and elderly.  Remember to wash your hands thoroughly throughout the day as this is the number one way to prevent the spread of infection!  Home Care:  Only take medications as instructed by your medical team.  Complete the entire course of an antibiotic.  Do not take these medications with alcohol.  A steam or ultrasonic humidifier can help congestion.  You can place a towel  over your head and breathe in the steam from hot water coming from a faucet.  Avoid close contacts especially the very young and the elderly.  Cover your mouth when you cough or sneeze.  Always remember to wash your hands.  Get Help Right Away If:  You develop worsening fever or sinus pain.  You develop a severe head ache or visual changes.  Your symptoms persist after you have completed your treatment plan.  Make sure you  Understand these instructions.  Will watch your condition.  Will get help right away if you are not doing well or get worse.  Your e-visit answers were reviewed by a board certified advanced clinical practitioner to complete your personal care plan.  Depending on the condition, your plan could have included both over the counter or prescription medications.  If there is a problem please reply  once you have received a response from your provider.  Your safety is important to [place].  If you have drug allergies check your prescription carefully.    You can use MyChart to ask questions about today's visit, request a non-urgent call back, or ask for a work or school excuse for 24 hours related to this e-Visit. If it has been greater than 24 hours you will need to follow up with your provider, or enter a new e-Visit to address those concerns.  You will get an e-mail in the next two days asking about your experience.  I hope that your e-visit has  been valuable and will speed your recovery. Thank you for using e-visits.   Greater than 5 minutes, yet less than 10 minutes of time have been spent researching, coordinating, and implementing care for this patient today

## 2019-04-08 ENCOUNTER — Telehealth: Payer: Self-pay | Admitting: Nurse Practitioner

## 2019-04-08 DIAGNOSIS — J019 Acute sinusitis, unspecified: Secondary | ICD-10-CM

## 2019-04-08 NOTE — Progress Notes (Signed)
We are sorry that you are not feeling well.  Here is how we plan to help!  Looks like you did evisit yesterday and were rx augmentin. There is nothing more to do for now. You need to take augmentin as rx and follow directions given below. Looks like work note was writtne for you yesterday.  Based on what you have shared with me it looks like you have sinusitis.  Sinusitis is inflammation and infection in the sinus cavities of the head.  Based on your presentation I believe you most likely have Acute Bacterial Sinusitis.  This is an infection caused by bacteria and is treated with antibiotics.You may use an oral decongestant such as Mucinex D or if you have glaucoma or high blood pressure use plain Mucinex. Saline nasal spray help and can safely be used as often as needed for congestion.  If you develop worsening sinus pain, fever or notice severe headache and vision changes, or if symptoms are not better after completion of antibiotic, please schedule an appointment with a health care provider.    Sinus infections are not as easily transmitted as other respiratory infection, however we still recommend that you avoid close contact with loved ones, especially the very young and elderly.  Remember to wash your hands thoroughly throughout the day as this is the number one way to prevent the spread of infection!  Home Care:  Only take medications as instructed by your medical team.  Complete the entire course of an antibiotic.  Do not take these medications with alcohol.  A steam or ultrasonic humidifier can help congestion.  You can place a towel over your head and breathe in the steam from hot water coming from a faucet.  Avoid close contacts especially the very young and the elderly.  Cover your mouth when you cough or sneeze.  Always remember to wash your hands.  Get Help Right Away If:  You develop worsening fever or sinus pain.  You develop a severe head ache or visual changes.  Your  symptoms persist after you have completed your treatment plan.  Make sure you  Understand these instructions.  Will watch your condition.  Will get help right away if you are not doing well or get worse.  Your e-visit answers were reviewed by a board certified advanced clinical practitioner to complete your personal care plan.  Depending on the condition, your plan could have included both over the counter or prescription medications.  If there is a problem please reply  once you have received a response from your provider.  Your safety is important to [place].  If you have drug allergies check your prescription carefully.    You can use MyChart to ask questions about today's visit, request a non-urgent call back, or ask for a work or school excuse for 24 hours related to this e-Visit. If it has been greater than 24 hours you will need to follow up with your provider, or enter a new e-Visit to address those concerns.  You will get an e-mail in the next two days asking about your experience.  I hope that your e-visit has been valuable and will speed your recovery. Thank you for using e-visits.   5-10 minutes spent reviewing and documenting in chart.

## 2019-08-22 ENCOUNTER — Encounter: Payer: Self-pay | Admitting: Physician Assistant

## 2019-08-22 ENCOUNTER — Telehealth: Payer: Self-pay | Admitting: Physician Assistant

## 2019-08-22 DIAGNOSIS — R059 Cough, unspecified: Secondary | ICD-10-CM

## 2019-08-22 DIAGNOSIS — J028 Acute pharyngitis due to other specified organisms: Secondary | ICD-10-CM

## 2019-08-22 DIAGNOSIS — R05 Cough: Secondary | ICD-10-CM

## 2019-08-22 MED ORDER — AMOXICILLIN 500 MG PO CAPS: 500.0000 mg | ORAL_CAPSULE | Freq: Two times a day (BID) | ORAL | 0 refills | Status: DC

## 2019-08-22 MED ORDER — BENZONATATE 100 MG PO CAPS: 100.0000 mg | ORAL_CAPSULE | Freq: Three times a day (TID) | ORAL | 0 refills | Status: DC | PRN

## 2019-08-22 NOTE — Progress Notes (Signed)
We are sorry that you are not feeling well.  Here is how we plan to help!  Based on what you have shared with me it is likely that you have pharyngitis.  Pharyngitis is inflammation and infection in the back of the throat. Your infection could be likely viral in nature, however, due to persistent worsening of your symptoms, I have prescribed Amoxicillin 500 mg twice a day for 10 days. I have also prescribed cough medication Tessalon Perles 100 mg take one pill three times daily as needed for cough. You have indicated that you have completed a Covid test.  I have also provided a work note stating you completed a visit today.    For throat pain, we recommend over the counter oral pain relief medications such as acetaminophen or aspirin, or anti-inflammatory medications such as ibuprofen or naproxen sodium. Topical treatments such as oral throat lozenges or sprays may be used as needed. Strep infections are not as easily transmitted as other respiratory infections, however we still recommend that you avoid close contact with loved ones, especially the very young and elderly.  Remember to wash your hands thoroughly throughout the day as this is the number one way to prevent the spread of infection and wipe down door knobs and counters with disinfectant.   Home Care:  Only take medications as instructed by your medical team.  Complete the entire course of an antibiotic.  Do not take these medications with alcohol.  A steam or ultrasonic humidifier can help congestion.  You can place a towel over your head and breathe in the steam from hot water coming from a faucet.  Avoid close contacts especially the very young and the elderly.  Cover your mouth when you cough or sneeze.  Always remember to wash your hands.  Get Help Right Away If:  You develop worsening fever or sinus pain.  You develop a severe head ache or visual changes.  Your symptoms persist after you have completed your treatment  plan.  Make sure you  Understand these instructions.  Will watch your condition.  Will get help right away if you are not doing well or get worse.  Your e-visit answers were reviewed by a board certified advanced clinical practitioner to complete your personal care plan.  Depending on the condition, your plan could have included both over the counter or prescription medications.  If there is a problem please reply  once you have received a response from your provider.  Your safety is important to [place].  If you have drug allergies check your prescription carefully.    You can use MyChart to ask questions about today's visit, request a non-urgent call back, or ask for a work or school excuse for 24 hours related to this e-Visit. If it has been greater than 24 hours you will need to follow up with your provider, or enter a new e-Visit to address those concerns.  You will get an e-mail in the next two days asking about your experience.  I hope that your e-visit has been valuable and will speed your recovery. Thank you for using e-visits.   I spent 5-10 minutes on review and completion of this note- [facility]

## 2020-02-20 ENCOUNTER — Telehealth: Payer: Self-pay | Admitting: Physician Assistant

## 2020-02-20 DIAGNOSIS — J029 Acute pharyngitis, unspecified: Secondary | ICD-10-CM

## 2020-02-20 NOTE — Progress Notes (Signed)
Hi Rashiya,   I'm sorry you are not feeling well.  I see you were treated for strep throat about 5 months ago with antibiotics.  I am concerned for possible Mono since you have been having symptoms for several months. I would feel more comfortable if you were evaluated in person and were tested.   Based on what you shared with me, I feel your condition warrants further evaluation and I recommend that you be seen for a face to face office visit.   NOTE: If you entered your credit card information for this eVisit, you will not be charged. You may see a "hold" on your card for the $35 but that hold will drop off and you will not have a charge processed.   If you are having a true medical emergency please call 911.      For an urgent face to face visit, [place] has five urgent care centers for your convenience:     [facility] Center at [facility] Directions [phone] [address] [place], [place] [address] . 10 am - 6pm Monday - Friday    [facility] Health Urgent Care Center [facility]) Get Driving Directions [phone] [address] [place], [place] [address] . 10 am to 8 pm Monday-Friday . 12 pm to 8 pm [facility] Urgent Care at [facility] Get Driving Directions [phone] [address] [place], [address] [place], [place] [address] . 8 am to 8 pm Monday-Friday . 9 am to 6 pm Saturday . 11 am to 6 pm Sunday     [facility] Health Urgent Care at [facility] Get Driving Directions  [phone] [address] [place], [place] [address] . 8 am to 8 pm Monday-Friday . 8 am to 4 pm [facility] Urgent Care at [facility] Directions [phone] [address] [place], [place] [address] . 12 pm to 6 pm Monday-Friday      Your e-visit answers were reviewed by a board certified advanced clinical practitioner to complete your personal care plan.  Thank you for  using e-Visits.

## 2020-04-11 ENCOUNTER — Telehealth: Payer: Self-pay | Admitting: Family

## 2020-04-11 DIAGNOSIS — R531 Weakness: Secondary | ICD-10-CM

## 2020-04-11 DIAGNOSIS — R112 Nausea with vomiting, unspecified: Secondary | ICD-10-CM

## 2020-04-11 NOTE — Progress Notes (Signed)
Based on what you shared with me, I feel your condition warrants further evaluation and I recommend that you be seen for a face to face office visit.  Given your symptoms of weakness, you need to be seen face to face to rule out a more a serious problem.    NOTE: If you entered your credit card information for this eVisit, you will not be charged. You may see a "hold" on your card for the $35 but that hold will drop off and you will not have a charge processed.   If you are having a true medical emergency please call 911.      For an urgent face to face visit, [place] has five urgent care centers for your convenience:     [facility] Health Urgent Care Center at [facility] Directions [phone] [address] [place], [place] [address] . 10 am - 6pm Monday - Friday    [facility] Health Urgent Care Center [facility]) Get Driving Directions [phone] [address] [place], [place] [address] . 10 am to 8 pm Monday-Friday . 12 pm to 8 pm [facility] Urgent Care at [facility] Get Driving Directions [phone] [address] [place] [address] [place], [place] [address] . 8 am to 8 pm Monday-Friday . 9 am to 6 pm Saturday . 11 am to 6 pm Sunday     [facility] Health Urgent Care at [facility] Get Driving Directions  [phone] [address] [place], [place] [address] . 8 am to 8 pm Monday-Friday . 8 am to 4 pm [facility] Urgent Care at [facility] Directions [phone] [address] [place], [place] [address] . 12 pm to 6 pm Monday-Friday      Your e-visit answers were reviewed by a board certified advanced clinical practitioner to complete your personal care plan.  Thank you for using e-Visits.

## 2020-04-12 ENCOUNTER — Emergency Department (INDEPENDENT_AMBULATORY_CARE_PROVIDER_SITE_OTHER)
Admission: EM | Admit: 2020-04-12 | Discharge: 2020-04-12 | Disposition: A | Discharge status: Home / Self Care | Payer: PRIVATE HEALTH INSURANCE | Source: Home / Self Care

## 2020-04-12 ENCOUNTER — Other Ambulatory Visit: Payer: Self-pay

## 2020-04-12 DIAGNOSIS — R197 Diarrhea, unspecified: Secondary | ICD-10-CM | POA: Diagnosis not present

## 2020-04-12 DIAGNOSIS — J069 Acute upper respiratory infection, unspecified: Secondary | ICD-10-CM

## 2020-04-12 DIAGNOSIS — R112 Nausea with vomiting, unspecified: Secondary | ICD-10-CM | POA: Diagnosis not present

## 2020-04-12 DIAGNOSIS — R52 Pain, unspecified: Secondary | ICD-10-CM

## 2020-04-12 DIAGNOSIS — R519 Headache, unspecified: Secondary | ICD-10-CM

## 2020-04-12 DIAGNOSIS — Z20822 Contact with and (suspected) exposure to covid-19: Secondary | ICD-10-CM

## 2020-04-12 MED ORDER — ONDANSETRON 4 MG PO TBDP: 4.0000 mg | ORAL_TABLET | Freq: Three times a day (TID) | ORAL | 0 refills | Status: DC | PRN

## 2020-04-12 MED ORDER — ACETAMINOPHEN 325 MG PO TABS
650.0000 mg | ORAL_TABLET | Freq: Four times a day (QID) | ORAL | Status: DC | PRN
  Administered 2020-04-12: 650 mg via ORAL

## 2020-04-12 NOTE — ED Provider Notes (Signed)
[facility]    CSN: [id number] Arrival date & time: 04/12/20  1106      History   Chief Complaint Chief Complaint  Patient presents with  . Nausea  . Migraine    HPI Jane Carpenter is a 25 y.o. female.   HPI  Jane Carpenter is a 25 y.o. female presenting to UC with c/o n/v/d with associated generalized headache, body aches, sore throat, mild cough and congestion.  Symptoms started 4 days ago.   Diarrhea has been improving but vomiting has not. She reports 3-4 episodes of vomiting in the last 24 hours. She has taken Daquil and Nyquil for HA, provides temporary relief. A family member, who she was around all last week, tested positive for COVID. Pt took an at-home test, which was negative.  Denies chest pain or SOB. Denies abdominal pain at this time. Not concerned for pregnancy. LMP about 3 weeks ago.  Past Medical History:  Diagnosis Date  . Asthma     There are no problems to display for this patient.   Past Surgical History:  Procedure Laterality Date  . CHOLECYSTECTOMY      OB History   No obstetric history on file.      Home Medications    Prior to Admission medications   Medication Sig Start Date End Date Taking? Authorizing Provider  ondansetron (ZOFRAN-ODT) 4 MG disintegrating tablet Take 1 tablet (4 mg total) by mouth every 8 (eight) hours as needed for nausea or vomiting. 04/12/20  Yes Nikiah Goin O, PA-C  amoxicillin (AMOXIL) 500 MG capsule Take 1 capsule (500 mg total) by mouth 2 (two) times daily. 08/22/19   Demetrio Lapping, PA-C  amoxicillin-clavulanate (AUGMENTIN) 875-125 MG tablet Take 1 tablet by mouth 2 (two) times daily. One po bid x 7 days 04/03/19   Muthersbaugh, Dahlia Client, PA-C  benzonatate (TESSALON) 100 MG capsule Take 1 capsule (100 mg total) by mouth 3 (three) times daily as needed. 08/22/19   Demetrio Lapping, PA-C  cetirizine (ZYRTEC ALLERGY) 10 MG tablet Take 1 tablet (10 mg total) by mouth daily. 06/17/18   Jeannie Fend, PA-C   cyclobenzaprine (FLEXERIL) 10 MG tablet Take 1 tablet (10 mg total) by mouth 2 (two) times daily as needed for muscle spasms. 01/21/18   Dartha Lodge, PA-C  fluticasone (FLONASE) 50 MCG/ACT nasal spray Place 2 sprays into both nostrils daily. 03/11/19   Daphine Deutscher, Mary-Margaret, FNP  ipratropium (ATROVENT) 0.03 % nasal spray Place 2 sprays into both nostrils 3 (three) times daily. 07/09/18   Ofilia Neas, PA-C  lidocaine (XYLOCAINE) 2 % solution Use as directed 5-10 mLs in the mouth or throat as needed for mouth pain (Gargle and spit). 07/09/18   Ofilia Neas, PA-C  naproxen (NAPROSYN) 500 MG tablet Take 1 tablet (500 mg total) by mouth 2 (two) times daily. 01/21/18   Dartha Lodge, PA-C    Family History Family History  Problem Relation Age of Onset  . Healthy Mother   . Cancer Father     Social History Social History   Tobacco Use  . Smoking status: Current Every Day Smoker    Packs/day: 0.50    Types: Cigarettes, E-cigarettes  . Smokeless tobacco: Never Used  Vaping Use  . Vaping Use: Every day  . Substances: Nicotine, THC, Flavoring  Substance Use Topics  . Alcohol use: Not Currently  . Drug use: Never     Allergies   Codeine   Review of Systems Review  of Systems  Constitutional: Positive for fatigue. Negative for chills and fever.  HENT: Positive for congestion and sore throat. Negative for ear pain, trouble swallowing and voice change.   Respiratory: Positive for cough. Negative for shortness of breath.   Cardiovascular: Negative for chest pain and palpitations.  Gastrointestinal: Positive for diarrhea, nausea and vomiting. Negative for abdominal pain.  Musculoskeletal: Positive for arthralgias, back pain and myalgias.  Skin: Negative for rash.  Neurological: Positive for headaches. Negative for dizziness and light-headedness.  All other systems reviewed and are negative.    Physical Exam Triage Vital Signs ED Triage Vitals  Enc Vitals Group     BP  04/12/20 1212 135/76     Pulse Rate 04/12/20 1212 89     Resp 04/12/20 1212 16     Temp 04/12/20 1212 98.3 F (36.8 C)     Temp Source 04/12/20 1212 Oral     SpO2 04/12/20 1212 100 %     Weight --      Height --      Head Circumference --      Peak Flow --      Pain Score 04/12/20 1210 9     Pain Loc --      Pain Edu? --      Excl. in GC? --    No data found.  Updated Vital Signs BP 135/76 (BP Location: Right Arm)   Pulse 89   Temp 98.3 F (36.8 C) (Oral)   Resp 16   SpO2 100%   Visual Acuity Right Eye Distance:   Left Eye Distance:   Bilateral Distance:    Right Eye Near:   Left Eye Near:    Bilateral Near:     Physical Exam Vitals and nursing note reviewed.  Constitutional:      General: She is not in acute distress.    Appearance: Normal appearance. She is well-developed and well-nourished. She is not ill-appearing, toxic-appearing or diaphoretic.  HENT:     Head: Normocephalic and atraumatic.     Right Ear: Tympanic membrane and ear canal normal.     Left Ear: Tympanic membrane and ear canal normal.     Nose: Nose normal.     Right Sinus: No maxillary sinus tenderness or frontal sinus tenderness.     Left Sinus: No maxillary sinus tenderness or frontal sinus tenderness.     Mouth/Throat:     Lips: Pink.     Mouth: Mucous membranes are moist.     Pharynx: Oropharynx is clear. Uvula midline. No pharyngeal swelling, oropharyngeal exudate, posterior oropharyngeal erythema or uvula swelling.  Eyes:     Extraocular Movements: EOM normal.  Cardiovascular:     Rate and Rhythm: Normal rate and regular rhythm.  Pulmonary:     Effort: Pulmonary effort is normal. No respiratory distress.     Breath sounds: Normal breath sounds. No stridor. No wheezing, rhonchi or rales.  Abdominal:     General: There is no distension.     Palpations: Abdomen is soft.     Tenderness: There is no abdominal tenderness. There is no right CVA tenderness or left CVA tenderness.   Musculoskeletal:        General: Normal range of motion.     Cervical back: Normal range of motion and neck supple.  Skin:    General: Skin is warm and dry.  Neurological:     Mental Status: She is alert and oriented to person, place, and time.  Psychiatric:  Mood and Affect: Mood and affect normal.        Behavior: Behavior normal.      UC Treatments / Results  Labs (all labs ordered are listed, but only abnormal results are displayed) Labs Reviewed  SARS-COV-2 RNA,(COVID-19) QUALITATIVE NAAT    EKG   Radiology No results found.  Procedures Procedures (including critical care time)  Medications Ordered in UC Medications  acetaminophen (TYLENOL) tablet 650 mg (650 mg Oral Given 04/12/20 1221)    Initial Impression / Assessment and Plan / UC Course  I have reviewed the triage vital signs and the nursing notes.  Pertinent labs & imaging results that were available during my care of the patient were reviewed by me and considered in my medical decision making (see chart for details).     No evidence of bacterial infection at this time. COVID pending Encouraged symptomatic tx F/u with PCP as needed Discussed symptoms that warrant emergent care in the ED. AVS given  Final Clinical Impressions(s) / UC Diagnoses   Final diagnoses:  Close exposure to COVID-19 virus  Nausea vomiting and diarrhea  Body aches  Generalized headache  URI with cough and congestion     Discharge Instructions     You may take 500mg  acetaminophen every 4-6 hours or in combination with ibuprofen 400-600mg  every 6-8 hours as needed for pain, inflammation, and fever.  Be sure to well hydrated with clear liquids and get at least 8 hours of sleep at night, preferably more while sick.   Please follow up with family medicine later this week if needed.   Call 911 or have someone drive you to the hospital if symptoms significantly worsening- severe pain, unable to keep down fluids  with the prescribed medication, dizziness/passing out or other new concerning symptoms develop.      ED Prescriptions    Medication Sig Dispense Auth. Provider   ondansetron (ZOFRAN-ODT) 4 MG disintegrating tablet Take 1 tablet (4 mg total) by mouth every 8 (eight) hours as needed for nausea or vomiting. 12 tablet , Lurene Shadow     PDMP not reviewed this encounter.   [place], Lurene Shadow 04/12/20 1321

## 2020-04-12 NOTE — Discharge Instructions (Signed)
You may take 500mg  acetaminophen every 4-6 hours or in combination with ibuprofen 400-600mg  every 6-8 hours as needed for pain, inflammation, and fever.  Be sure to well hydrated with clear liquids and get at least 8 hours of sleep at night, preferably more while sick.   Please follow up with family medicine later this week if needed.   Call 911 or have someone drive you to the hospital if symptoms significantly worsening- severe pain, unable to keep down fluids with the prescribed medication, dizziness/passing out or other new concerning symptoms develop.

## 2020-04-12 NOTE — ED Triage Notes (Signed)
Patient presents to Urgent Care with complaints of nausea, diarrhea, chills, headache since 4 days ago. Patient reports the diarrhea has gotten better, but she still has it occasionally. Did a home covid test over the weekend and it was negative. Would like work note for missing work today.

## 2020-04-14 LAB — SARS-COV-2 RNA,(COVID-19) QUALITATIVE NAAT: SARS CoV2 RNA: NOT DETECTED

## 2020-05-17 ENCOUNTER — Telehealth: Payer: PRIVATE HEALTH INSURANCE | Admitting: Emergency Medicine

## 2020-05-17 DIAGNOSIS — M5440 Lumbago with sciatica, unspecified side: Secondary | ICD-10-CM

## 2020-05-17 NOTE — Progress Notes (Signed)
Based on the numbness that you describe, I recommend that you be seen in person to ensure that your sensation and reflexes are normal.  You will need to have a physical exam by a provider to ensure that you don't have something serious causing your symptoms.   NOTE: If you entered your credit card information for this eVisit, you will not be charged. You may see a "hold" on your card for the $35 but that hold will drop off and you will not have a charge processed.   If you are having a true medical emergency please call 911.      For an urgent face to face visit, [facility] has five urgent care centers for your convenience:     [facility] at [facility] Directions [phone] [address] [place], [place] [address] . 10 am - 6pm Monday - Friday    [facility]) Get Driving Directions [phone] [address] [place], [place] [address] . 10 am to 8 pm Monday-Friday . 12 pm to 8 pm [facility] at [facility] Get Driving Directions [phone] [address], [address] [place], [place] [address] . 8 am to 8 pm Monday-Friday . 9 am to 6 pm Saturday . 11 am to 6 pm Sunday     [facility] at [facility] Get Driving Directions  [phone] [address] [place], [place] [address] . 8 am to 8 pm Monday-Friday . 8 am to 4 pm [facility] at [facility] Directions [phone] [address] [place], [place] [address] . 12 pm to 6 pm Monday-Friday      Your e-visit answers were reviewed by a board certified advanced clinical practitioner to complete your personal care plan.  Thank you for using e-Visits.    Approximately 5 minutes was used in reviewing the patient's chart, questionnaire, prescribing medications, and documentation.

## 2020-05-18 ENCOUNTER — Encounter (INDEPENDENT_AMBULATORY_CARE_PROVIDER_SITE_OTHER): Payer: Self-pay

## 2020-05-18 ENCOUNTER — Emergency Department (INDEPENDENT_AMBULATORY_CARE_PROVIDER_SITE_OTHER): Payer: PRIVATE HEALTH INSURANCE

## 2020-05-18 ENCOUNTER — Other Ambulatory Visit: Payer: Self-pay

## 2020-05-18 ENCOUNTER — Encounter: Payer: Self-pay | Admitting: Emergency Medicine

## 2020-05-18 ENCOUNTER — Emergency Department (INDEPENDENT_AMBULATORY_CARE_PROVIDER_SITE_OTHER)
Admission: EM | Admit: 2020-05-18 | Discharge: 2020-05-18 | Disposition: A | Discharge status: Home / Self Care | Payer: PRIVATE HEALTH INSURANCE | Source: Home / Self Care | Attending: Family Medicine | Admitting: Family Medicine

## 2020-05-18 DIAGNOSIS — M25551 Pain in right hip: Secondary | ICD-10-CM

## 2020-05-18 DIAGNOSIS — M545 Low back pain, unspecified: Secondary | ICD-10-CM | POA: Diagnosis not present

## 2020-05-18 DIAGNOSIS — M5441 Lumbago with sciatica, right side: Secondary | ICD-10-CM | POA: Diagnosis not present

## 2020-05-18 DIAGNOSIS — M7061 Trochanteric bursitis, right hip: Secondary | ICD-10-CM

## 2020-05-18 MED ORDER — PREDNISONE 20 MG PO TABS: ORAL_TABLET | ORAL | 0 refills | Status: DC

## 2020-05-18 MED ORDER — CYCLOBENZAPRINE HCL 10 MG PO TABS: 10.0000 mg | ORAL_TABLET | Freq: Every day | ORAL | 0 refills | Status: AC

## 2020-05-18 MED ORDER — KETOROLAC TROMETHAMINE 60 MG/2ML IM SOLN
60.0000 mg | Freq: Once | INTRAMUSCULAR | Status: AC
  Administered 2020-05-18: 60 mg via INTRAMUSCULAR

## 2020-05-18 MED ORDER — ACETAMINOPHEN 325 MG PO TABS
650.0000 mg | ORAL_TABLET | Freq: Once | ORAL | Status: AC
  Administered 2020-05-18: 650 mg via ORAL

## 2020-05-18 NOTE — Discharge Instructions (Addendum)
Apply ice pack to right hip and right lower back for 20 to 30 minutes, 3 to 4 times daily  Continue until pain decreases.  May take Tylenol as needed for pain. Begin range of motion and stretching exercises as tolerated.

## 2020-05-18 NOTE — ED Provider Notes (Signed)
[facility]    CSN: [id number] Arrival date & time: 05/18/20  1218      History   Chief Complaint Chief Complaint  Patient presents with  . Hip Pain  . Leg Pain    right    HPI Jane Carpenter is a 25 y.o. female.   Three days ago [place], patient suddenly experienced pain in her right leg radiating from her hip laterally to her lower leg and foot that she describes as a burning/tingling sensation.  Patient denies injury to her leg/hip recently but notes that her job involves much walking.   She denies bowel or bladder dysfunction, and no saddle numbness.  She recalls falling on her right hip about 7 years ago resulting in mild pain since then.  The history is provided by the patient.  Leg Pain Location:  Hip and leg Time since incident:  3 days Injury: no   Hip location:  R hip Leg location:  R upper leg and L lower leg Pain details:    Quality:  Burning   Severity:  Moderate   Onset quality:  Sudden   Duration:  3 days   Timing:  Constant   Progression:  Unchanged Chronicity:  New Prior injury to area:  Yes Relieved by:  Nothing Worsened by:  Bearing weight and activity Ineffective treatments:  Ice and NSAIDs Associated symptoms: back pain and decreased ROM   Associated symptoms: no fatigue, no fever, no muscle weakness, no numbness, no stiffness and no tingling     Past Medical History:  Diagnosis Date  . Asthma     There are no problems to display for this patient.   Past Surgical History:  Procedure Laterality Date  . CHOLECYSTECTOMY      OB History   No obstetric history on file.      Home Medications    Prior to Admission medications   Medication Sig Start Date End Date Taking? Authorizing Provider  predniSONE (DELTASONE) 20 MG tablet Take one tab by mouth twice daily for 4 days, then one daily. Take with food. 05/18/20  Yes Lattie Haw, MD  cyclobenzaprine (FLEXERIL) 10 MG tablet  Take 1 tablet (10 mg total) by mouth at bedtime. 05/18/20   Lattie Haw, MD  cetirizine (ZYRTEC ALLERGY) 10 MG tablet Take 1 tablet (10 mg total) by mouth daily. Patient not taking: Reported on 05/18/2020 06/17/18 05/18/20  Army Melia A, PA-C  fluticasone [facility]) 50 MCG/ACT nasal spray Place 2 sprays into both nostrils daily. Patient not taking: Reported on 05/18/2020 03/11/19 05/18/20  Bennie Pierini, FNP  ipratropium (ATROVENT) 0.03 % nasal spray Place 2 sprays into both nostrils 3 (three) times daily. Patient not taking: Reported on 05/18/2020 07/09/18 05/18/20  Ofilia Neas, PA-C    Family History Family History  Problem Relation Age of Onset  . Healthy Mother   . Cancer Father   . Healthy Brother   . ADD / ADHD Brother   . Schizophrenia Brother     Social History Social History   Tobacco Use  . Smoking status: Current Every Day Smoker    Packs/day: 0.50    Types: Cigarettes, E-cigarettes  . Smokeless tobacco: Never Used  Vaping Use  . Vaping Use: Every day  . Substances: Nicotine, THC, Flavoring  Substance Use Topics  . Alcohol use: Not Currently  . Drug use: Never     Allergies   Codeine   Review of  Systems Review of Systems  Constitutional: Positive for activity change. Negative for appetite change, chills, diaphoresis, fatigue and fever.  Genitourinary: Negative for difficulty urinating, dysuria, flank pain, frequency, hematuria, pelvic pain and urgency.  Musculoskeletal: Positive for back pain. Negative for stiffness.  Skin: Negative.   Neurological: Negative.        Right leg paresthesias     Physical Exam Triage Vital Signs ED Triage Vitals  Enc Vitals Group     BP 05/18/20 1229 115/71     Pulse Rate 05/18/20 1229 87     Resp 05/18/20 1229 16     Temp 05/18/20 1229 98.1 F (36.7 C)     Temp Source 05/18/20 1229 Oral     SpO2 05/18/20 1229 97 %     Weight 05/18/20 1236 145 lb (65.8 kg)     Height 05/18/20 1236 5\' 3"  (1.6 m)     Head  Circumference --      Peak Flow --      Pain Score 05/18/20 1229 4     Pain Loc --      Pain Edu? --      Excl. in GC? --    No data found.  Updated Vital Signs BP 115/71 (BP Location: Right Arm)   Pulse 87   Temp 98.1 F (36.7 C) (Oral)   Resp 16   Ht 5\' 3"  (1.6 m)   Wt 65.8 kg   LMP 04/22/2020 (Exact Date)   SpO2 97%   BMI 25.69 kg/m   Visual Acuity Right Eye Distance:   Left Eye Distance:   Bilateral Distance:    Right Eye Near:   Left Eye Near:    Bilateral Near:     Physical Exam Vitals and nursing note reviewed.  Constitutional:      General: She is not in acute distress. HENT:     Head: Normocephalic.     Mouth/Throat:     Pharynx: Oropharynx is clear.  Eyes:     Conjunctiva/sclera: Conjunctivae normal.     Pupils: Pupils are equal, round, and reactive to light.  Cardiovascular:     Rate and Rhythm: Normal rate.     Heart sounds: Normal heart sounds.  Pulmonary:     Breath sounds: Normal breath sounds.  Abdominal:     Palpations: Abdomen is soft.     Tenderness: There is no abdominal tenderness.  Musculoskeletal:     Cervical back: Normal range of motion.       Back:     Right lower leg: No edema.     Left lower leg: No edema.     Comments: Back:  Range of motion decreased.  Can not heel/toe walk and squat without difficulty. Tenderness in the midline and right paraspinous muscles from L3 to Sacral area.  Straight leg raising test is positive at about 45 degrees..  Sitting knee extension test is positive. Strength and sensation in the lower extremities is normal.  Achilles reflexes are normal.  Left patellar reflex normal.  Right patellar reflex is hyper-reflexic.  Skin:    General: Skin is warm and dry.     Findings: No rash.  Neurological:     General: No focal deficit present.     Mental Status: She is alert.      UC Treatments / Results  Labs (all labs ordered are listed, but only abnormal results are displayed) Labs Reviewed - No data  to display  EKG   Radiology DG Lumbar Spine  Complete  Result Date: 05/18/2020 CLINICAL DATA:  Low back pain radiating to right leg and hip EXAM: LUMBAR SPINE - COMPLETE 4+ VIEW COMPARISON:  None. FINDINGS: There is no evidence of lumbar spine fracture. Alignment is normal. Intervertebral disc spaces are maintained. Prior cholecystectomy. IMPRESSION: Negative. Electronically Signed   By: Charlett Nose M.D.   On: 05/18/2020 14:32   DG Hip Unilat W or Wo Pelvis 2-3 Views Right  Result Date: 05/18/2020 CLINICAL DATA:  Right hip pain EXAM: DG HIP (WITH OR WITHOUT PELVIS) 2-3V RIGHT COMPARISON:  None. FINDINGS: No fracture or dislocation is seen. Bilateral hip joint spaces are preserved. Visualized bony pelvis appears intact. IMPRESSION: Negative. Electronically Signed   By: Charline Bills M.D.   On: 05/18/2020 15:54    Procedures Procedures (including critical care time)  Medications Ordered in UC Medications  acetaminophen (TYLENOL) tablet 650 mg (650 mg Oral Given 05/18/20 1308)  ketorolac (TORADOL) injection 60 mg (60 mg Intramuscular Given 05/18/20 1347)    Initial Impression / Assessment and Plan / UC Course  I have reviewed the triage vital signs and the nursing notes.  Pertinent labs & imaging results that were available during my care of the patient were reviewed by me and considered in my medical decision making (see chart for details).    Negative LS spine, hip, and pelvis x-rays reassuring. Administered Toradol 60mg  IM  Begin prednisone burst/taper, and Flexeril at bedtime. Followup with Dr. ([facility]) if not improving about two weeks.    Final Clinical Impressions(s) / UC Diagnoses   Final diagnoses:  Acute right-sided low back pain with right-sided sciatica  Greater trochanteric bursitis of right hip     Discharge Instructions     Apply ice pack to right hip and right lower back for 20 to 30 minutes, 3 to 4 times daily  Continue  until pain decreases.  May take Tylenol as needed for pain. Begin range of motion and stretching exercises as tolerated.    ED Prescriptions    Medication Sig Dispense Auth. Provider   cyclobenzaprine (FLEXERIL) 10 MG tablet Take 1 tablet (10 mg total) by mouth at bedtime. 10 tablet Rodney Langton, MD   predniSONE (DELTASONE) 20 MG tablet Take one tab by mouth twice daily for 4 days, then one daily. Take with food. 12 tablet Lattie Haw, MD        Lattie Haw, MD 05/19/20 [phone]

## 2020-05-18 NOTE — ED Triage Notes (Signed)
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

## 2021-04-09 ENCOUNTER — Telehealth: Payer: PRIVATE HEALTH INSURANCE | Admitting: Nurse Practitioner

## 2021-04-09 DIAGNOSIS — J40 Bronchitis, not specified as acute or chronic: Secondary | ICD-10-CM | POA: Diagnosis not present

## 2021-04-09 MED ORDER — ALBUTEROL SULFATE HFA 108 (90 BASE) MCG/ACT IN AERS: 2.0000 | INHALATION_SPRAY | Freq: Four times a day (QID) | RESPIRATORY_TRACT | 0 refills | Status: AC | PRN

## 2021-04-09 MED ORDER — AZITHROMYCIN 250 MG PO TABS: ORAL_TABLET | ORAL | 0 refills | Status: AC

## 2021-04-09 NOTE — Progress Notes (Signed)
We are sorry that you are not feeling well.  Here is how we plan to help!  Based on your presentation I believe you most likely have A cough due to bacteria.  When patients have a fever and a productive cough with a change in color or increased sputum production, we are concerned about bacterial bronchitis.  If left untreated it can progress to pneumonia.  If your symptoms do not improve with your treatment plan it is important that you contact your provider.   I have prescribed Azithromyin 250 mg: two tablets now and then one tablet daily for 4 additonal days    In addition you may use A non-prescription cough medication called Mucinex DM: take 2 tablets every 12 hours.  We will also be sending in a prescription for an Albuterol inhaler that you can use up to every 4-6 hours as needed for cough and wheezing.   From your responses in the eVisit questionnaire you describe inflammation in the upper respiratory tract which is causing a significant cough.  This is commonly called Bronchitis and has four common causes:   Allergies Viral Infections Acid Reflux Bacterial Infection Allergies, viruses and acid reflux are treated by controlling symptoms or eliminating the cause. An example might be a cough caused by taking certain blood pressure medications. You stop the cough by changing the medication. Another example might be a cough caused by acid reflux. Controlling the reflux helps control the cough.  USE OF BRONCHODILATOR ("RESCUE") INHALERS: There is a risk from using your bronchodilator too frequently.  The risk is that over-reliance on a medication which only relaxes the muscles surrounding the breathing tubes can reduce the effectiveness of medications prescribed to reduce swelling and congestion of the tubes themselves.  Although you feel brief relief from the bronchodilator inhaler, your asthma may actually be worsening with the tubes becoming more swollen and filled with mucus.  This can delay  other crucial treatments, such as oral steroid medications. If you need to use a bronchodilator inhaler daily, several times per day, you should discuss this with your provider.  There are probably better treatments that could be used to keep your asthma under control.     HOME CARE Only take medications as instructed by your medical team. Complete the entire course of an antibiotic. Drink plenty of fluids and get plenty of rest. Avoid close contacts especially the very young and the elderly Cover your mouth if you cough or cough into your sleeve. Always remember to wash your hands A steam or ultrasonic humidifier can help congestion.   GET HELP RIGHT AWAY IF: You develop worsening fever. You become short of breath You cough up blood. Your symptoms persist after you have completed your treatment plan MAKE SURE YOU  Understand these instructions. Will watch your condition. Will get help right away if you are not doing well or get worse.    Thank you for choosing an e-visit.  Your e-visit answers were reviewed by a board certified advanced clinical practitioner to complete your personal care plan. Depending upon the condition, your plan could have included both over the counter or prescription medications.  Please review your pharmacy choice. Make sure the pharmacy is open so you can pick up prescription now. If there is a problem, you may contact your provider through [employer] and have the prescription routed to another pharmacy.  Your safety is important to [place]. If you have drug allergies check your prescription carefully.   For the  next 24 hours you can use MyChart to ask questions about today's visit, request a non-urgent call back, or ask for a work or school excuse. You will get an email in the next two days asking about your experience. I hope that your e-visit has been valuable and will speed your recovery.   I spent approximately 7 minutes reviewing the patient's  history, current symptoms and coordinating their plan of care today.    Meds ordered this encounter  Medications   azithromycin (ZITHROMAX) 250 MG tablet    Sig: Take 2 tablets on day 1, then 1 tablet daily on days 2 through 5    Dispense:  6 tablet    Refill:  0   albuterol (VENTOLIN HFA) 108 (90 Base) MCG/ACT inhaler    Sig: Inhale 2 puffs into the lungs every 6 (six) hours as needed for wheezing or shortness of breath.    Dispense:  8 g    Refill:  0

## 2021-04-11 ENCOUNTER — Telehealth: Payer: PRIVATE HEALTH INSURANCE | Admitting: Physician Assistant

## 2021-04-11 ENCOUNTER — Telehealth: Payer: PRIVATE HEALTH INSURANCE | Admitting: Emergency Medicine

## 2021-04-11 DIAGNOSIS — J208 Acute bronchitis due to other specified organisms: Secondary | ICD-10-CM | POA: Diagnosis not present

## 2021-04-11 DIAGNOSIS — B9689 Other specified bacterial agents as the cause of diseases classified elsewhere: Secondary | ICD-10-CM

## 2021-04-11 DIAGNOSIS — R0982 Postnasal drip: Secondary | ICD-10-CM

## 2021-04-11 MED ORDER — FLUTICASONE PROPIONATE 50 MCG/ACT NA SUSP: 2.0000 | Freq: Every day | NASAL | 0 refills | Status: AC

## 2021-04-11 MED ORDER — BENZONATATE 100 MG PO CAPS: 100.0000 mg | ORAL_CAPSULE | Freq: Three times a day (TID) | ORAL | 0 refills | Status: AC | PRN

## 2021-04-11 NOTE — Patient Instructions (Signed)
Jane Carpenter, thank you for joining Piedad Climes, PA-C for today's virtual visit.  While this provider is not your primary care provider (PCP), if your PCP is located in our provider database this encounter information will be shared with them immediately following your visit.  Consent: (Patient) Jane Carpenter provided verbal consent for this virtual visit at the beginning of the encounter.  Current Medications:  Current Outpatient Medications:    albuterol (VENTOLIN HFA) 108 (90 Base) MCG/ACT inhaler, Inhale 2 puffs into the lungs every 6 (six) hours as needed for wheezing or shortness of breath., Disp: 8 g, Rfl: 0   azithromycin (ZITHROMAX) 250 MG tablet, Take 2 tablets on day 1, then 1 tablet daily on days 2 through 5, Disp: 6 tablet, Rfl: 0   benzonatate (TESSALON) 100 MG capsule, Take 1 capsule (100 mg total) by mouth 3 (three) times daily as needed for cough., Disp: 30 capsule, Rfl: 0   cyclobenzaprine (FLEXERIL) 10 MG tablet, Take 1 tablet (10 mg total) by mouth at bedtime., Disp: 10 tablet, Rfl: 0   fluticasone (FLONASE) 50 MCG/ACT nasal spray, Place 2 sprays into both nostrils daily., Disp: 16 g, Rfl: 0   Medications ordered in this encounter:  Meds ordered this encounter  Medications   fluticasone (FLONASE) 50 MCG/ACT nasal spray    Sig: Place 2 sprays into both nostrils daily.    Dispense:  16 g    Refill:  0    Order Specific Question:   Supervising Provider    Answer:   MILLER, BRIAN [[id number]]   benzonatate (TESSALON) 100 MG capsule    Sig: Take 1 capsule (100 mg total) by mouth 3 (three) times daily as needed for cough.    Dispense:  30 capsule    Refill:  0    Order Specific Question:   Supervising Provider    Answer:   Hyacinth Meeker, BRIAN [[id number]]     *If you need refills on other medications prior to your next appointment, please contact your pharmacy*  Follow-Up: Call back or seek an in-person evaluation if the symptoms worsen or if the condition fails to  improve as anticipated.  Other Instructions Take antibiotic (Azithromycin) as directed.  Increase fluids.  Get plenty of rest. Use Mucinex for congestion. Use the Tessalon for cough. Start the saline nasal rinses and Flonase as directed. Take a daily probiotic (I recommend Align or Culturelle, but even Activia Yogurt may be beneficial).  A humidifier placed in the bedroom may offer some relief for a dry, scratchy throat of nasal irritation.  Read information below on acute bronchitis. Please call or return to clinic if symptoms are not improving.  Acute Bronchitis Bronchitis is when the airways that extend from the windpipe into the lungs get red, puffy, and painful (inflamed). Bronchitis often causes thick spit (mucus) to develop. This leads to a cough. A cough is the most common symptom of bronchitis. In acute bronchitis, the condition usually begins suddenly and goes away over time (usually in 2 weeks). Smoking, allergies, and asthma can make bronchitis worse. Repeated episodes of bronchitis may cause more lung problems.  HOME CARE Rest. Drink enough fluids to keep your pee (urine) clear or pale yellow (unless you need to limit fluids as told by your doctor). Only take over-the-counter or prescription medicines as told by your doctor. Avoid smoking and secondhand smoke. These can make bronchitis worse. If you are a smoker, think about using nicotine gum or skin patches. Quitting smoking will help your  lungs heal faster. Reduce the chance of getting bronchitis again by: Washing your hands often. Avoiding people with cold symptoms. Trying not to touch your hands to your mouth, nose, or eyes. Follow up with your doctor as told.  GET HELP IF: Your symptoms do not improve after 1 week of treatment. Symptoms include: Cough. Fever. Coughing up thick spit. Body aches. Chest congestion. Chills. Shortness of breath. Sore throat.  GET HELP RIGHT AWAY IF:  You have an increased fever. You  have chills. You have severe shortness of breath. You have bloody thick spit (sputum). You throw up (vomit) often. You lose too much body fluid (dehydration). You have a severe headache. You faint.  MAKE SURE YOU:  Understand these instructions. Will watch your condition. Will get help right away if you are not doing well or get worse. Document Released: 09/06/2007 Document Revised: 11/20/2012 Document Reviewed: 09/10/2012 [facility] Patient Information 2015 [place], [place]. This information is not intended to replace advice given to you by your health care provider. Make sure you discuss any questions you have with your health care provider.    If you have been instructed to have an in-person evaluation today at a local Urgent Care facility, please use the link below. It will take you to a list of all of our available [facility], including address, phone number and hours of operation. Please do not delay care.  [facility]  If you or a family member do not have a primary care provider, use the link below to schedule a visit and establish care. When you choose a [facility] primary care physician or advanced practice provider, you gain a long-term partner in health. Find a Primary Care Provider  Learn more about [facility] in-office and virtual care options: [facility] - Get Care Now

## 2021-04-11 NOTE — Progress Notes (Signed)
Virtual Visit Consent   Jane Carpenter, you are scheduled for a virtual visit with a [facility] provider today.     Just as with appointments in the office, your consent must be obtained to participate.  Your consent will be active for this visit and any virtual visit you may have with one of our providers in the next 365 days.     If you have a MyChart account, a copy of this consent can be sent to you electronically.  All virtual visits are billed to your insurance company just like a traditional visit in the office.    As this is a virtual visit, video technology does not allow for your provider to perform a traditional examination.  This may limit your provider's ability to fully assess your condition.  If your provider identifies any concerns that need to be evaluated in person or the need to arrange testing (such as labs, EKG, etc.), we will make arrangements to do so.     Although advances in technology are sophisticated, we cannot ensure that it will always work on either your end or our end.  If the connection with a video visit is poor, the visit may have to be switched to a telephone visit.  With either a video or telephone visit, we are not always able to ensure that we have a secure connection.     I need to obtain your verbal consent now.   Are you willing to proceed with your visit today?    Jane Carpenter has provided verbal consent on 04/11/2021 for a virtual visit (video or telephone).   Piedad Climes, [place]   Date: 04/11/2021 9:45 AM   Virtual Visit via Video Note   I, Piedad Climes, connected with  Jane Carpenter  ([id number], 1995/05/26) on 04/11/21 at  9:30 AM EST by a video-enabled telemedicine application and verified that I am speaking with the correct person using two identifiers.  Location: Patient: Virtual Visit Location Patient: Home Provider: Virtual Visit Location Provider: Home Office   I discussed the limitations of evaluation and  management by telemedicine and the availability of in person appointments. The patient expressed understanding and agreed to proceed.    History of Present Illness: Jane Carpenter is a 26 y.o. who identifies as a female who was assigned female at birth, and is being seen today for advice on what to do for significant PND causing her to cough, gag and one episode of non-bloody emesis. Was evaluated via e-visit on 1/7 and diagnosed with acute bronchitis. Was started on Azithromycin and albuterol. Noted her chest symptoms have improved since starting treatment. Still with some head congestion but more substantial PND that is triggering her cough and sometimes making her gag. Notes still with occasional borderline fever 99-100. Denies any significant nausea. Denies chest pain or SOB. Some sinus pressure without pain. Has been taking OTC Emergen-C, Dayquil and Ibuprofen.  HPI: HPI  Problems: There are no problems to display for this patient.   Allergies:  Allergies  Allergen Reactions   Codeine     Nausea   Iodine Other (See Comments)   Medications:  Current Outpatient Medications:    albuterol (VENTOLIN HFA) 108 (90 Base) MCG/ACT inhaler, Inhale 2 puffs into the lungs every 6 (six) hours as needed for wheezing or shortness of breath., Disp: 8 g, Rfl: 0   azithromycin (ZITHROMAX) 250 MG tablet, Take 2 tablets on day 1, then 1 tablet daily on days 2 through 5,  Disp: 6 tablet, Rfl: 0   benzonatate (TESSALON) 100 MG capsule, Take 1 capsule (100 mg total) by mouth 3 (three) times daily as needed for cough., Disp: 30 capsule, Rfl: 0   cyclobenzaprine (FLEXERIL) 10 MG tablet, Take 1 tablet (10 mg total) by mouth at bedtime., Disp: 10 tablet, Rfl: 0   fluticasone (FLONASE) 50 MCG/ACT nasal spray, Place 2 sprays into both nostrils daily., Disp: 16 g, Rfl: 0  Observations/Objective: Patient is well-developed, well-nourished in no acute distress.  Resting comfortably at home.  Head is normocephalic,  atraumatic.  No labored breathing. Speech is clear and coherent with logical content.  Patient is alert and oriented at baseline.   Assessment and Plan: 1. Acute bacterial bronchitis - fluticasone (FLONASE) 50 MCG/ACT nasal spray; Place 2 sprays into both nostrils daily.  Dispense: 16 g; Refill: 0 - benzonatate (TESSALON) 100 MG capsule; Take 1 capsule (100 mg total) by mouth 3 (three) times daily as needed for cough.  Dispense: 30 capsule; Refill: 0  Chest symptoms improving. She is to complete entire course of antibiotic. Supportive measures and OTC medications reviewed. Will add-on Tessalon for cough.   2. Post-nasal drip  Will have her increase fluids. Start saline nasal rinse twice daily. Flonase per orders. Can add-on OTC antihistamine. Start Mucinex-DM to thin congestion as well.  Tessalon to aid with cough. Strict in-person follow-up precautions reviewed.   Follow Up Instructions: I discussed the assessment and treatment plan with the patient. The patient was provided an opportunity to ask questions and all were answered. The patient agreed with the plan and demonstrated an understanding of the instructions.  A copy of instructions were sent to the patient via MyChart unless otherwise noted below.   The patient was advised to call back or seek an in-person evaluation if the symptoms worsen or if the condition fails to improve as anticipated.  Time:  I spent 10 minutes with the patient via telehealth technology discussing the above problems/concerns.    Piedad Climes, PA-C

## 2021-04-11 NOTE — Progress Notes (Signed)
Pt's symptoms too complicated to manage by evisit - I recommended video visit or urgent care visit.   Rica Mast, PhD, FNP-BC

## 2022-12-10 IMAGING — DX DG LUMBAR SPINE COMPLETE 4+V
5 series · 5 of 5 positions shown · non-contrast
Comparison: None.

CLINICAL DATA: Low back pain radiating to right leg and hip

EXAM:
LUMBAR SPINE - COMPLETE 4+ VIEW

[l-spine ap]
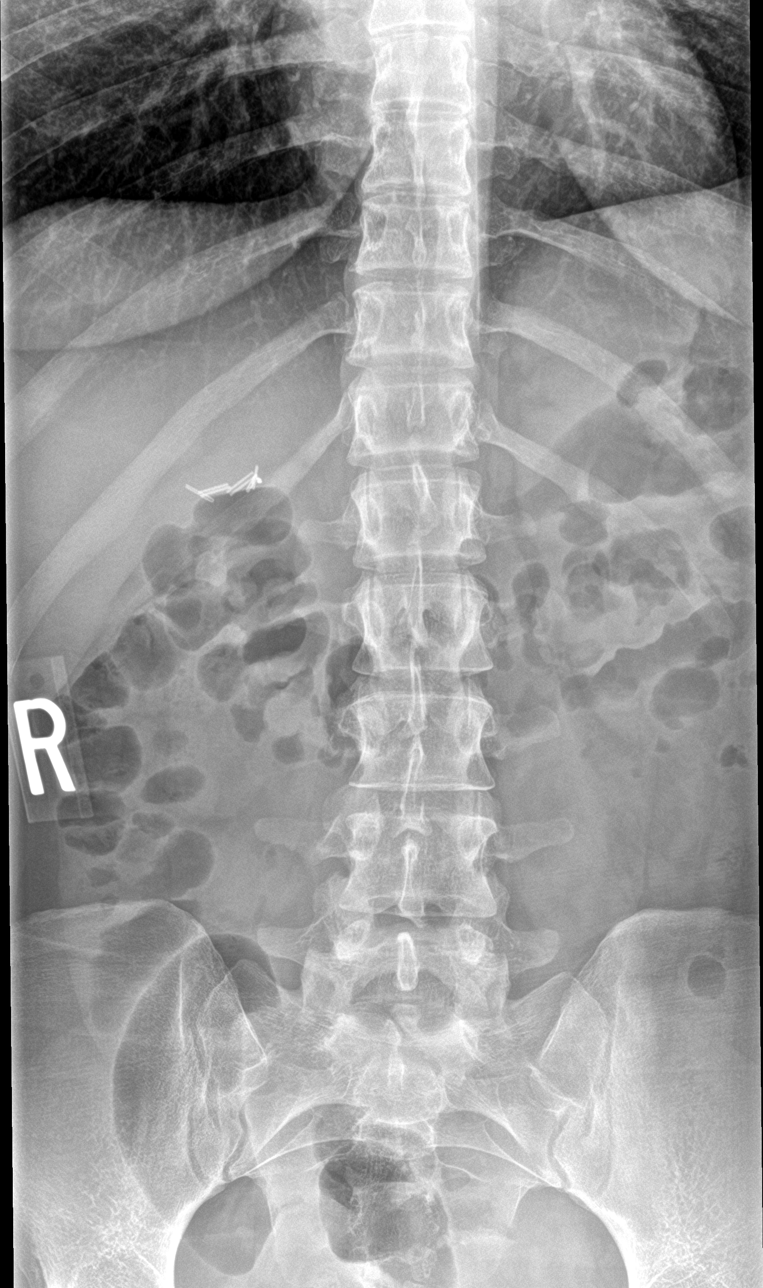

[l-spine obl (1 of 2)]
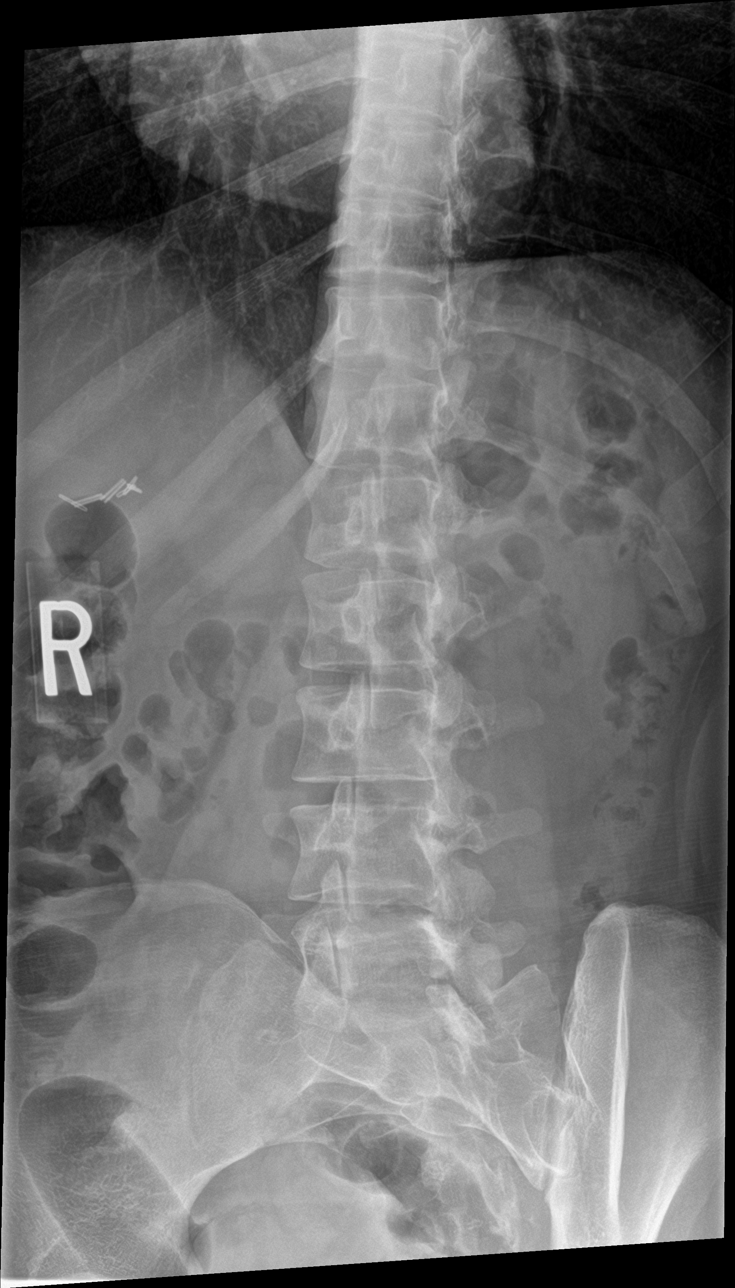

[l-spine obl (2 of 2)]
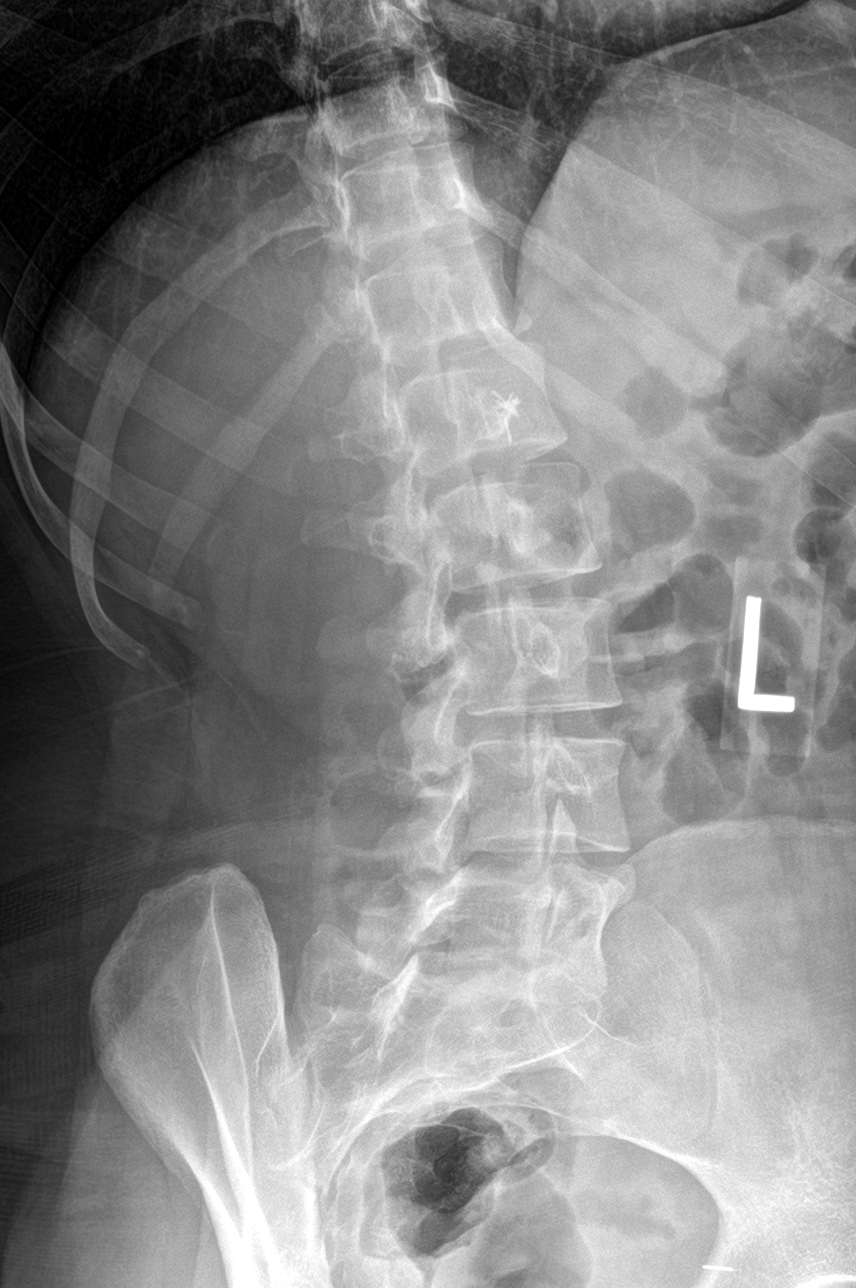

[l-spine lat]
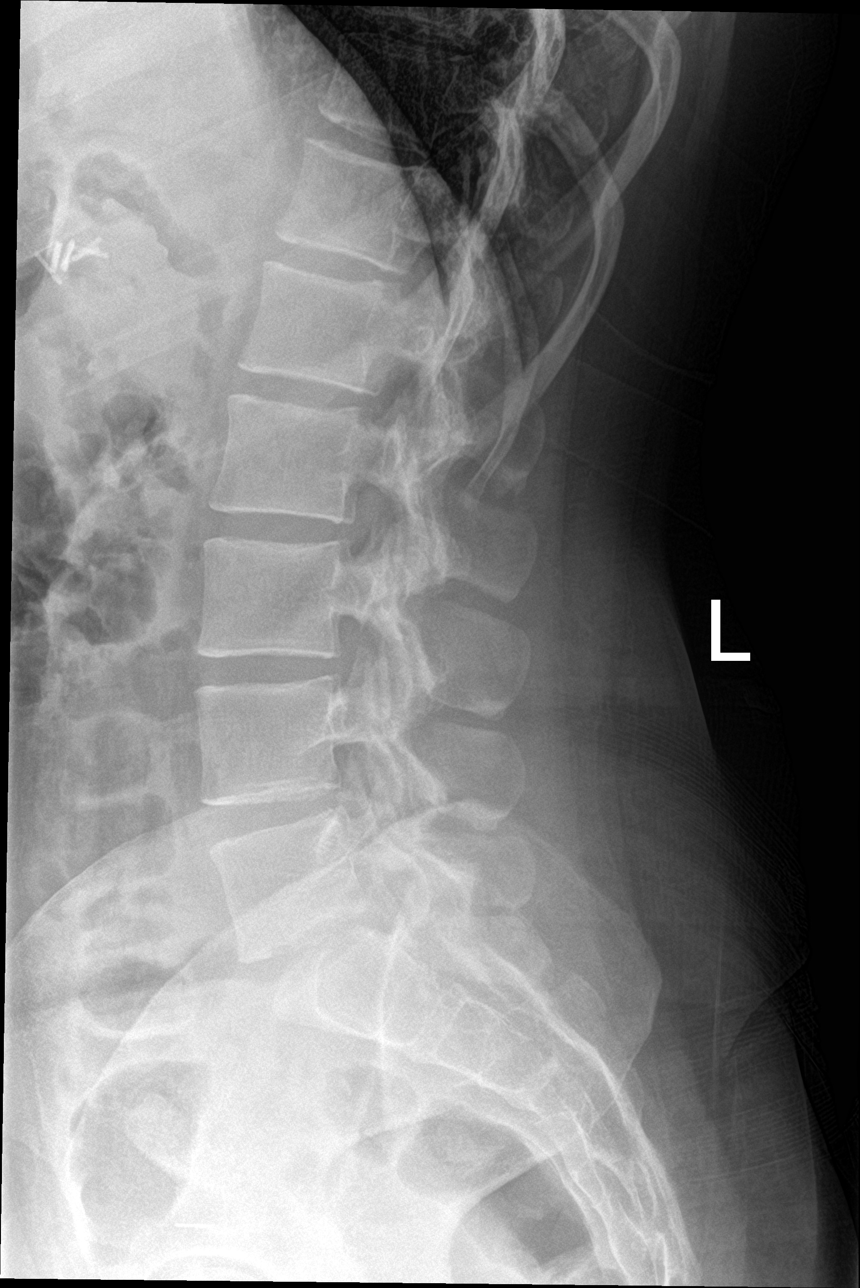

[l-spine spot]
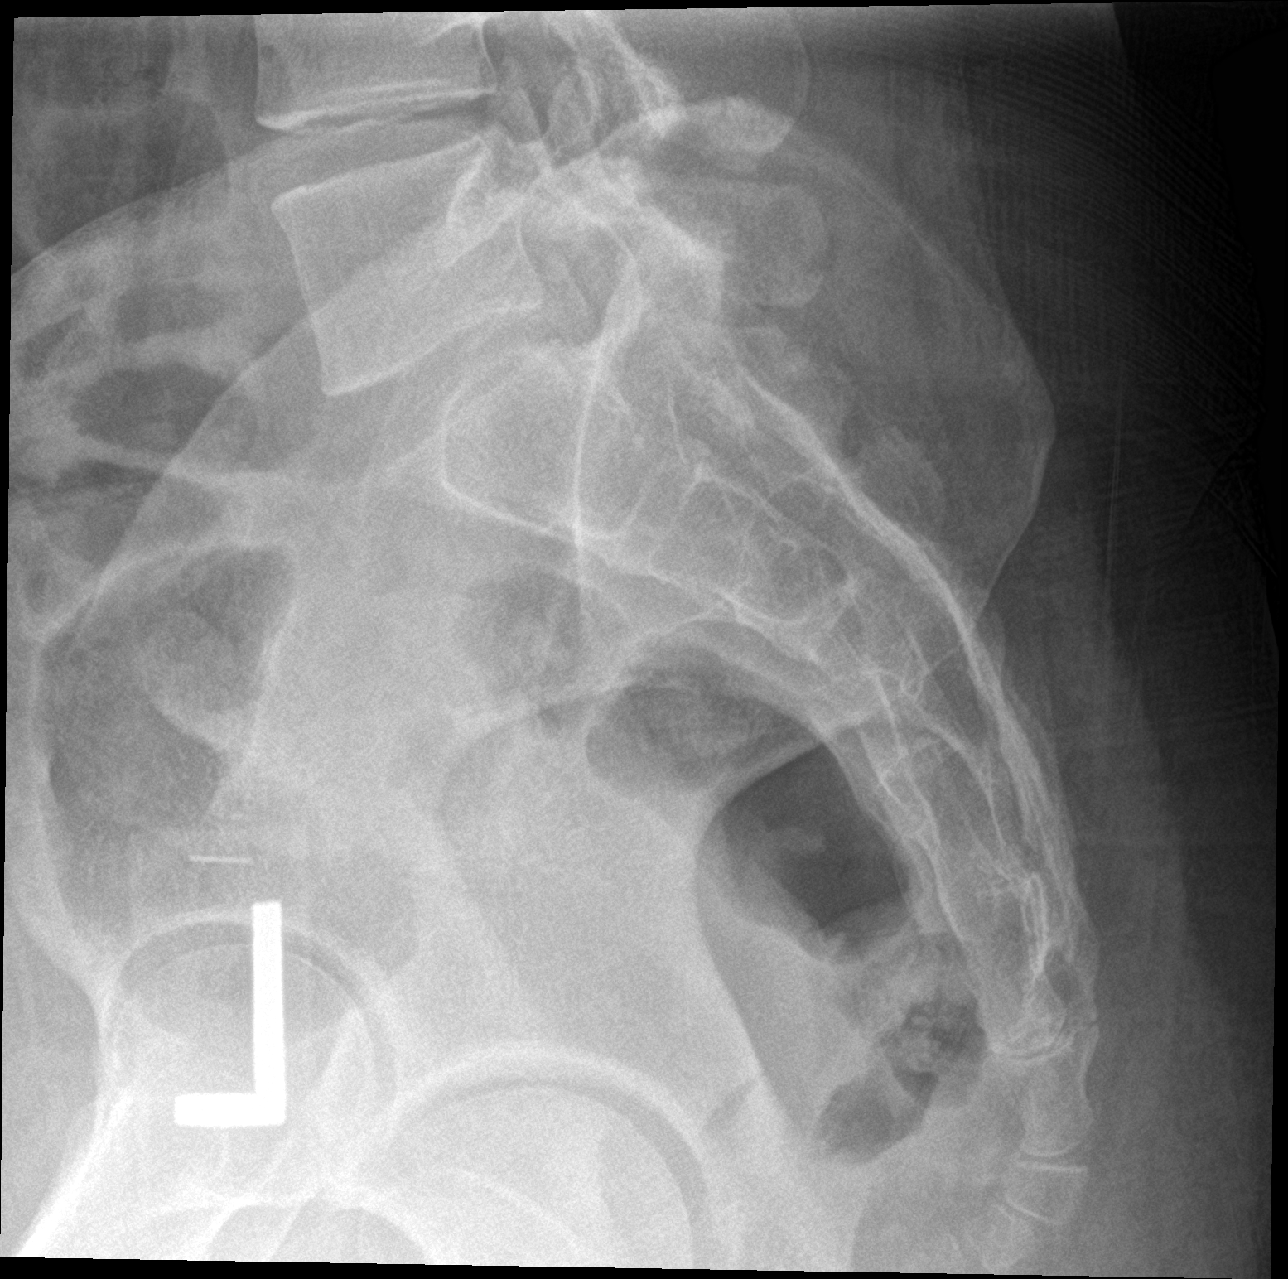

[5 of 5 positions shown; findings below may reference images not displayed]

FINDINGS: There is no evidence of lumbar spine fracture. Alignment is normal.
Intervertebral disc spaces are maintained. Prior cholecystectomy.
IMPRESSION: Negative.
